# Patient Record
Sex: Male | Born: 1960 | Race: White | Hispanic: No | Marital: Married | Smoking: Never smoker
Health system: Southern US, Community
[De-identification: ages and names within clinical notes are randomized; demographics above are authoritative.]

## PROBLEM LIST (undated history)

## (undated) DIAGNOSIS — E119 Type 2 diabetes mellitus without complications: Secondary | ICD-10-CM

## (undated) HISTORY — DX: Type 2 diabetes mellitus without complications: E11.9

---

## 2017-08-10 ENCOUNTER — Ambulatory Visit: Payer: Self-pay | Admitting: Family Medicine

## 2017-08-24 ENCOUNTER — Ambulatory Visit (INDEPENDENT_AMBULATORY_CARE_PROVIDER_SITE_OTHER): Payer: Commercial Managed Care - PPO | Admitting: Family Medicine

## 2017-08-24 ENCOUNTER — Encounter: Payer: Self-pay | Admitting: Family Medicine

## 2017-08-24 VITALS — BP 123/66 | HR 78 | Temp 97.4°F | Ht 67.0 in | Wt 167.1 lb

## 2017-08-24 DIAGNOSIS — Z1211 Encounter for screening for malignant neoplasm of colon: Secondary | ICD-10-CM

## 2017-08-24 DIAGNOSIS — Z0000 Encounter for general adult medical examination without abnormal findings: Secondary | ICD-10-CM | POA: Diagnosis not present

## 2017-08-24 DIAGNOSIS — K4090 Unilateral inguinal hernia, without obstruction or gangrene, not specified as recurrent: Secondary | ICD-10-CM | POA: Diagnosis not present

## 2017-08-24 DIAGNOSIS — E119 Type 2 diabetes mellitus without complications: Secondary | ICD-10-CM | POA: Diagnosis not present

## 2017-08-24 DIAGNOSIS — R7301 Impaired fasting glucose: Secondary | ICD-10-CM

## 2017-08-24 LAB — BAYER DCA HB A1C WAIVED: HB A1C (BAYER DCA - WAIVED): 11 % — ABNORMAL HIGH (ref <7.0)

## 2017-08-24 LAB — URINALYSIS
Bilirubin, UA: NEGATIVE
KETONES UA: NEGATIVE
Leukocytes, UA: NEGATIVE
Nitrite, UA: NEGATIVE
Protein, UA: NEGATIVE
SPEC GRAV UA: 1.025 (ref 1.005–1.030)
Urobilinogen, Ur: 0.2 mg/dL (ref 0.2–1.0)
pH, UA: 5.5 (ref 5.0–7.5)

## 2017-08-24 MED ORDER — LANCETS MISC. MISC: 3 refills | Status: DC

## 2017-08-24 MED ORDER — METFORMIN HCL 500 MG PO TABS: 500.0000 mg | ORAL_TABLET | Freq: Two times a day (BID) | ORAL | 0 refills | Status: DC

## 2017-08-24 MED ORDER — GLUCOSE BLOOD VI STRP: ORAL_STRIP | 3 refills | Status: DC

## 2017-08-24 MED ORDER — BLOOD GLUCOSE MONITOR SYSTEM W/DEVICE KIT: 1.0000 | PACK | Freq: Two times a day (BID) | 0 refills | Status: DC

## 2017-08-24 NOTE — Patient Instructions (Signed)

## 2017-08-24 NOTE — Progress Notes (Signed)
Subjective:  Patient ID: Cory Morales, male    DOB: 15-Jul-1960  Age: 57 y.o. MRN: [id number]  CC: New Patient (Initial Visit)   HPI Cory Morales presents for new onset  diabetes. Patient checks blood sugar at home.  This is because he has a family history and he has been concerned. Readings have been running first around 150 and up to 180 then went to 200.  He says that his feet feel cold at times.  He is concerned that may be diabetic circulation. Patient denies symptoms such as polyuria, polydipsia, excessive hunger, nausea No significant hypoglycemic spells noted. He is currently taking no medications of any type for any medical problem.  Patient tells me that he has a positive family history for diabetes and his mother is under treatment with oral medications.  He is a non-smoker.  He is currently denying polyuria polydipsia excessive hunger.  He brings in a [facility] assessment showing that his cholesterol has been in normal range.  His PSA is 1.1.  He has no sign of aortic abdominal aneurysm.  Essentially all of his parameters were normal with the exception of the A1c.  That report was scanned and is attached.  History Cory Morales has no past medical history on file.   He has no past surgical history on file.   His family history includes COPD in his mother; Diabetes in his mother; Heart disease in his mother.He reports that he has never smoked. He has never used smokeless tobacco. He reports that he does not drink alcohol or use drugs.  No current outpatient medications on file prior to visit.   No current facility-administered medications on file prior to visit.     ROS Review of Systems  Constitutional: Negative for activity change, appetite change, chills, diaphoresis, fatigue, fever and unexpected weight change.  HENT: Negative for congestion, ear pain, hearing loss, postnasal drip, rhinorrhea, sore throat, tinnitus and trouble swallowing.   Eyes: Negative for  photophobia, pain, discharge and redness.  Respiratory: Negative for apnea, cough, choking, chest tightness, shortness of breath, wheezing and stridor.   Cardiovascular: Negative for chest pain, palpitations and leg swelling.  Gastrointestinal: Negative for abdominal distention, abdominal pain, blood in stool, constipation, diarrhea, nausea and vomiting.  Endocrine: Negative for cold intolerance, heat intolerance, polydipsia, polyphagia and polyuria.  Genitourinary: Positive for scrotal swelling. Negative for difficulty urinating, dysuria, enuresis, flank pain, frequency, genital sores, hematuria and urgency.  Musculoskeletal: Negative for arthralgias and joint swelling.  Skin: Negative for color change, rash and wound.  Allergic/Immunologic: Negative for immunocompromised state.  Neurological: Negative for dizziness, tremors, seizures, syncope, facial asymmetry, speech difficulty, weakness, light-headedness, numbness and headaches.  Hematological: Does not bruise/bleed easily.  Psychiatric/Behavioral: Negative for agitation, behavioral problems, confusion, decreased concentration, dysphoric mood, hallucinations, sleep disturbance and suicidal ideas. The patient is not nervous/anxious and is not hyperactive.     Objective:  BP 123/66   Pulse 78   Temp (!) 97.4 F (36.3 C) (Oral)   Ht 5' 7"  (1.702 m)   Wt 167 lb 2 oz (75.8 kg)   BMI 26.18 kg/m   BP Readings from Last 3 Encounters:  08/24/17 123/66    Wt Readings from Last 3 Encounters:  08/24/17 167 lb 2 oz (75.8 kg)     Physical Exam  Constitutional: He is oriented to person, place, and time. He appears well-developed and well-nourished.  HENT:  Head: Normocephalic and atraumatic.  Mouth/Throat: Oropharynx is clear and moist.  Eyes: Pupils  are equal, round, and reactive to light. EOM are normal.  Neck: Normal range of motion. No tracheal deviation present. No thyromegaly present.  Cardiovascular: Normal rate, regular rhythm  and normal heart sounds. Exam reveals no gallop and no friction rub.  No murmur heard. Pulmonary/Chest: Breath sounds normal. He has no wheezes. He has no rales.  Abdominal: Soft. He exhibits no mass. There is no tenderness. A hernia is present. Hernia confirmed positive in the right inguinal area and confirmed positive in the left inguinal area.  Musculoskeletal: Normal range of motion. He exhibits no edema.  Neurological: He is alert and oriented to person, place, and time.  Skin: Skin is warm and dry.  Psychiatric: He has a normal mood and affect.    Results for orders placed or performed in visit on 08/24/17  Bayer DCA Hb A1c Waived  Result Value Ref Range   Bayer DCA Hb A1c Waived 11.0 (H) <7.0 %  Urinalysis  Result Value Ref Range   Specific Gravity, UA 1.025 1.005 - 1.030   pH, UA 5.5 5.0 - 7.5   Color, UA Yellow Yellow   Appearance Ur Clear Clear   Leukocytes, UA Negative Negative   Protein, UA Negative Negative/Trace   Glucose, UA 3+ (A) Negative   Ketones, UA Negative Negative   RBC, UA Trace (A) Negative   Bilirubin, UA Negative Negative   Urobilinogen, Ur 0.2 0.2 - 1.0 mg/dL   Nitrite, UA Negative Negative   Abdominal aortic aneurysm ultrasound reviewed showing negative for aneurysm.  Assessment & Plan:   Cory Morales was seen today for new patient (initial visit).  Diagnoses and all orders for this visit:  Elevated fasting blood sugar -     Cancel: Bayer DCA Hb A1c Waived -     Cancel: Glucose Hemocue Waived  Newly diagnosed diabetes ([place]) -     C-peptide -     CMP14+EGFR -     Bayer DCA Hb A1c Waived -     Urinalysis  Screening for colon cancer -     Ambulatory referral to Gastroenterology  Right inguinal hernia -     Ambulatory referral to General Surgery  Well adult exam  Other orders -     metFORMIN (GLUCOPHAGE) 500 MG tablet; Take 1 tablet (500 mg total) by mouth 2 (two) times daily with a meal. -     Blood Glucose Monitoring Suppl (BLOOD GLUCOSE  MONITOR SYSTEM) w/Device KIT; 1 Device by Does not apply route 2 (two) times daily. -     Lancets Misc. MISC; Use to check blood sugars -     glucose blood (IGLUCOSE TEST STRIPS) test strip; Use as instructed      I am having Cory Morales start on metFORMIN, Blood Glucose Monitor System, Lancets Misc., and glucose blood.  Meds ordered this encounter  Medications  . metFORMIN (GLUCOPHAGE) 500 MG tablet    Sig: Take 1 tablet (500 mg total) by mouth 2 (two) times daily with a meal.    Dispense:  60 tablet    Refill:  0  . Blood Glucose Monitoring Suppl (BLOOD GLUCOSE MONITOR SYSTEM) w/Device KIT    Sig: 1 Device by Does not apply route 2 (two) times daily.    Dispense:  1 each    Refill:  0  . Lancets Misc. MISC    Sig: Use to check blood sugars    Dispense:  100 each    Refill:  3  . glucose blood (IGLUCOSE TEST  STRIPS) test strip    Sig: Use as instructed    Dispense:  100 each    Refill:  3   Elevated A1c of 11.0 noted today.  Patient will be placed on medication and extensive discussion regarding diabetic diet with handout given as well as regular exercise were reviewed.  Additionally diabetic pathophysiology particularly type I versus type II insulin resistance difference between complex and simple carbs were all reviewed.  Patient was encouraged to check his blood sugar fasting and postprandial daily.  Follow-up: Return in about 6 weeks (around 10/05/2017).  Claretta Fraise, M.D.

## 2017-08-25 ENCOUNTER — Encounter (INDEPENDENT_AMBULATORY_CARE_PROVIDER_SITE_OTHER): Payer: Self-pay | Admitting: *Deleted

## 2017-08-25 LAB — CMP14+EGFR
A/G RATIO: 1.6 (ref 1.2–2.2)
ALT: 20 IU/L (ref 0–44)
AST: 11 IU/L (ref 0–40)
Albumin: 4.5 g/dL (ref 3.5–5.5)
Alkaline Phosphatase: 98 IU/L (ref 39–117)
BILIRUBIN TOTAL: 0.3 mg/dL (ref 0.0–1.2)
BUN/Creatinine Ratio: 16 (ref 9–20)
BUN: 16 mg/dL (ref 6–24)
CHLORIDE: 97 mmol/L (ref 96–106)
CO2: 24 mmol/L (ref 20–29)
Calcium: 9.3 mg/dL (ref 8.7–10.2)
Creatinine, Ser: 1 mg/dL (ref 0.76–1.27)
GFR calc non Af Amer: 83 mL/min/{1.73_m2} (ref >59)
GFR, EST AFRICAN AMERICAN: 96 mL/min/{1.73_m2} (ref >59)
Globulin, Total: 2.8 g/dL (ref 1.5–4.5)
Glucose: 267 mg/dL — ABNORMAL HIGH (ref 65–99)
Potassium: 4.6 mmol/L (ref 3.5–5.2)
Sodium: 136 mmol/L (ref 134–144)
TOTAL PROTEIN: 7.3 g/dL (ref 6.0–8.5)

## 2017-08-25 LAB — C-PEPTIDE: C-Peptide: 2.7 ng/mL (ref 1.1–4.4)

## 2017-09-16 ENCOUNTER — Ambulatory Visit: Payer: Commercial Managed Care - PPO | Admitting: General Surgery

## 2017-09-22 ENCOUNTER — Other Ambulatory Visit: Payer: Self-pay | Admitting: Family Medicine

## 2017-10-05 ENCOUNTER — Encounter: Payer: Self-pay | Admitting: General Surgery

## 2017-10-05 ENCOUNTER — Encounter: Payer: Self-pay | Admitting: Family Medicine

## 2017-10-05 ENCOUNTER — Ambulatory Visit (INDEPENDENT_AMBULATORY_CARE_PROVIDER_SITE_OTHER): Payer: Commercial Managed Care - PPO | Admitting: General Surgery

## 2017-10-05 ENCOUNTER — Ambulatory Visit (INDEPENDENT_AMBULATORY_CARE_PROVIDER_SITE_OTHER): Payer: Commercial Managed Care - PPO | Admitting: Family Medicine

## 2017-10-05 VITALS — BP 135/87 | HR 68 | Temp 98.6°F | Resp 18 | Wt 168.0 lb

## 2017-10-05 VITALS — BP 122/77 | HR 70 | Ht 67.0 in | Wt 168.4 lb

## 2017-10-05 DIAGNOSIS — Z23 Encounter for immunization: Secondary | ICD-10-CM

## 2017-10-05 DIAGNOSIS — E119 Type 2 diabetes mellitus without complications: Secondary | ICD-10-CM | POA: Diagnosis not present

## 2017-10-05 DIAGNOSIS — K4090 Unilateral inguinal hernia, without obstruction or gangrene, not specified as recurrent: Secondary | ICD-10-CM | POA: Diagnosis not present

## 2017-10-05 NOTE — Progress Notes (Signed)
[facility] History and Physical  Reason for Referral:Inguinal Hernia  Referring Physician:  Dr. Livia Morales   Chief Complaint    Inguinal Hernia      Cory Morales is a 57 y.o. male.  HPI: Cory Morales is a 57 yo who recently saw Dr. Livia Morales for initiation of Primary Care due to concern for elevated BS and cold feet. He was ultimately given a prescription for metformin, and underwent a physical that revealed a right inguinal hernia and a small left inguinal hernia.   He reports that the right side has been present for 1-2 years and is uncomfortable at times. He has to push the hernia back in when he is on his feet at work, and that the bulge can cause some pain.  He has never had issues with the hernia getting stuck out or hard, and denies any obstructive type symptoms. He reports eating without issue and having daily BMs. He has never had a colonoscopy.  He denies smoking.   Past Medical History:  Diagnosis Date  . Diabetes mellitus without complication Cory Morales)     Past Surgical History:  Procedure Laterality Date  . NO PAST SURGERIES      Family History  Problem Relation Age of Onset  . COPD Mother   . Diabetes Mother   . Heart disease Mother     Social History   Tobacco Use  . Smoking status: Never Smoker  . Smokeless tobacco: Never Used  Substance Use Topics  . Alcohol use: Never    Frequency: Never  . Drug use: Never    Medications: I have reviewed the patient's current medications. Allergies as of 10/05/2017   No Known Allergies     Medication List        Accurate as of 10/05/17 11:59 PM. Always use your most recent med list.          Blood Glucose Monitor System w/Device Kit 1 Device by Does not apply route 2 (two) times daily.   glucose blood test strip Commonly known as:  IGLUCOSE TEST STRIPS Use as instructed   Lancets Misc. Misc Use to check blood sugars   metFORMIN 500 MG tablet Commonly known as:  GLUCOPHAGE TAKE  (1)   TABLET TWICE A DAY WITH MEALS (BREAKFAST AND SUPPER)        ROS:  A comprehensive review of systems was negative except for: Gastrointestinal: positive for right inguinal discomfort with hernia   Blood pressure 135/87, pulse 68, temperature 98.6 F (37 C), temperature source Temporal, resp. rate 18, weight 168 lb (76.2 kg). Physical Exam  Constitutional: He is oriented to person, place, and time. He appears well-developed and well-nourished.  HENT:  Head: Normocephalic.  Eyes: Pupils are equal, round, and reactive to light.  Neck: Normal range of motion. Neck supple.  Cardiovascular: Normal rate and regular rhythm.  Pulmonary/Chest: Effort normal and breath sounds normal.  Abdominal: Soft. He exhibits no distension. There is no tenderness. A hernia is present. Hernia confirmed positive in the right inguinal area and confirmed positive in the left inguinal area.  Small left inguinal hernia, right inguinal hernia with large internal ring appreciated  Musculoskeletal: Normal range of motion. He exhibits no edema.  Neurological: He is alert and oriented to person, place, and time.  Skin: Skin is warm and dry.  Psychiatric: He has a normal mood and affect. His behavior is normal. Judgment and thought content normal.  Vitals reviewed.   Results: None   Assessment &  Plan:  Cory Morales. Cory Morales is a 57 y.o. male with a right and left inguinal hernia. He has symptoms on the right and has not noticed the left. He is active and does have to stand and be physical at work.  He has been recently placed on metformin for his diabetes and A1C of 11.    We have discussed the options for inguinal hernia repair and the option of open versus a laparoscopic surgery. We have discussed that given his symptoms the right side does need to be repaired.  We have discussed that I do open hernia repairs with mesh, and additional reasons for laparoscopic repair.  We have discussed the risk of incarceration and the  need for elective repair if having some symptoms such as discomfort with the herniation.    Discussed the risk and benefits including, bleeding, infection, use of mesh, risk of recurrence, risk of nerve damage causing numbness or changes in sensation, risk of damage to the cord structures. The patient understands the risk and benefits of repair with mesh.    We discussed the need for recovery and no lifting > 10 lbs for 8 weeks after surgery.  The patient is going to think about his options, and will let us know what he decides to do in the upcoming weeks.   All questions were answered to the satisfaction of the patient.    Cory Morales 10/06/2017, 7:52 AM

## 2017-10-05 NOTE — Patient Instructions (Signed)
Open Hernia Repair, Adult Open hernia repair is a surgical procedure to fix a hernia. A hernia occurs when an internal organ or tissue pushes out through a weak spot in the abdominal wall muscles. Hernias commonly occur in the groin and around the navel. Most hernias tend to get worse over time. Often, surgery is done to prevent the hernia from becoming bigger, uncomfortable, or an emergency. Emergency surgery may be needed if abdominal contents get stuck in the opening (incarcerated hernia) or the blood supply gets cut off (strangulated hernia). In an open repair, an incision is made in the abdomen to perform the surgery. Tell a health care provider about:  Any allergies you have.  All medicines you are taking, including vitamins, herbs, eye drops, creams, and over-the-counter medicines.  Any problems you or family members have had with anesthetic medicines.  Any blood or bone disorders you have.  Any surgeries you have had.  Any medical conditions you have, including any recent cold or flu symptoms.  Whether you are pregnant or may be pregnant. What are the risks? Generally, this is a safe procedure. However, problems may occur, including:  Long-lasting (chronic) pain.  Bleeding.  Infection.  Damage to the testicle. This can cause shrinking or swelling.  Damage to the bladder, blood vessels, intestine, or nerves near the hernia.  Trouble passing urine.  Allergic reactions to medicines.  Return of the hernia.  What happens before the procedure? Staying hydrated Follow instructions from your health care provider about hydration, which may include:  Up to 2 hours before the procedure - you may continue to drink clear liquids, such as water, clear fruit juice, black coffee, and plain tea.  Eating and drinking restrictions Follow instructions from your health care provider about eating and drinking, which may include:  8 hours before the procedure - stop eating heavy meals  or foods such as meat, fried foods, or fatty foods.  6 hours before the procedure - stop eating light meals or foods, such as toast or cereal.  6 hours before the procedure - stop drinking milk or drinks that contain milk.  2 hours before the procedure - stop drinking clear liquids.  Medicines  Ask your health care provider about: ? Changing or stopping your regular medicines. This is especially important if you are taking diabetes medicines or blood thinners. ? Taking medicines such as aspirin and ibuprofen. These medicines can thin your blood. Do not take these medicines before your procedure if your health care provider instructs you not to.  You may be given antibiotic medicine to help prevent infection. General instructions  You may have blood tests or imaging studies.  Ask your health care provider how your surgical site will be marked or identified.  If you smoke, do not smoke for at least 2 weeks before your procedure or for as long as told by your health care provider.  Let your health care provider know if you develop a cold or any infection before your surgery.  Plan to have someone take you home from the hospital or clinic.  If you will be going home right after the procedure, plan to have someone with you for 24 hours. What happens during the procedure?  To reduce your risk of infection: ? Your health care team will wash or sanitize their hands. ? Your skin will be washed with soap. ? Hair may be removed from the surgical area.  An IV tube will be inserted into one of your veins.  You will be given one or more of the following: ? A medicine to help you relax (sedative). ? A medicine to numb the area (local anesthetic). ? A medicine to make you fall asleep (general anesthetic).  Your surgeon will make an incision over the hernia.  The tissues of the hernia will be moved back into place.  The edges of the hernia may be stitched together.  The opening in the  abdominal muscles will be closed with stitches (sutures). Or, your surgeon will place a mesh patch made of manmade (synthetic) material over the opening.  The incision will be closed.  A bandage (dressing) may be placed over the incision. The procedure may vary among health care providers and hospitals. What happens after the procedure?  Your blood pressure, heart rate, breathing rate, and blood oxygen level will be monitored until the medicines you were given have worn off.  You may be given medicine for pain.  Do not drive for 24 hours if you received a sedative. This information is not intended to replace advice given to you by your health care provider. Make sure you discuss any questions you have with your health care provider. Document Released: 11/11/2000 Document Revised: 12/06/2015 Document Reviewed: 10/30/2015 Elsevier Interactive Patient Education  2018 [employer].  Inguinal Hernia, Adult An inguinal hernia is when fat or the intestines push through the area where the leg meets the lower belly (groin) and make a rounded lump (bulge). This condition happens over time. There are three types of inguinal hernias. These types include:  Hernias that can be pushed back into the belly (are reducible).  Hernias that cannot be pushed back into the belly (are incarcerated).  Hernias that cannot be pushed back into the belly and lose their blood supply (get strangulated). This type needs emergency surgery.  Follow these instructions at home: Lifestyle  Drink enough fluid to keep your urine (pee) clear or pale yellow.  Eat plenty of fruits, vegetables, and whole grains. These have a lot of fiber. Talk with your doctor if you have questions.  Avoid lifting heavy objects.  Avoid standing for long periods of time.  Do not use tobacco products. These include cigarettes, chewing tobacco, or e-cigarettes. If you need help quitting, ask your doctor.  Try to stay at a healthy  weight. General instructions  Do not try to force the hernia back in.  Watch your hernia for any changes in color or size. Let your doctor know if there are any changes.  Take over-the-counter and prescription medicines only as told by your doctor.  Keep all follow-up visits as told by your doctor. This is important. Contact a doctor if:  You have a fever.  You have new symptoms.  Your symptoms get worse. Get help right away if:  The area where the legs meets the lower belly has: ? Pain that gets worse suddenly. ? A bulge that gets bigger suddenly and does not go down. ? A bulge that turns red or purple. ? A bulge that is painful to the touch.  You are a man and your scrotum: ? Suddenly feels painful. ? Suddenly changes in size.  You feel sick to your stomach (nauseous) and this feeling does not go away.  You throw up (vomit) and this keeps happening.  You feel your heart beating a lot more quickly than normal.  You cannot poop (have a bowel movement) or pass gas. This information is not intended to replace advice given to you  by your health care provider. Make sure you discuss any questions you have with your health care provider. Document Released: 06/18/2006 Document Revised: 10/24/2015 Document Reviewed: 03/28/2014 Elsevier Interactive Patient Education  2018 [employer].

## 2017-10-06 ENCOUNTER — Encounter: Payer: Self-pay | Admitting: General Surgery

## 2017-10-06 ENCOUNTER — Encounter: Payer: Self-pay | Admitting: Family Medicine

## 2017-10-06 NOTE — Progress Notes (Signed)
   Subjective:  Patient ID: Cory Morales, male    DOB: Jun 19, 1960  Age: 57 y.o. MRN: [id number]  CC: Diabetes (pt here today following up on his diabetes)   HPI Cory Morales presents forFollow-up of diabetes. Newly diagnosed. Started monitoring 1 week ago. Log attached   150-190  fasting and 140-239 postprandial Patient denies symptoms such as polyuria, polydipsia, excessive hunger, nausea No significant hypoglycemic spells noted. Medications reviewed. Pt reports taking them regularly without complication/adverse reaction being reported today.    History Cory Morales has a past medical history of Diabetes mellitus without complication ([place]).   He has a past surgical history that includes No past surgeries.   His family history includes COPD in his mother; Diabetes in his mother; Heart disease in his mother.He reports that he has never smoked. He has never used smokeless tobacco. He reports that he does not drink alcohol or use drugs.  Current Outpatient Medications on File Prior to Visit  Medication Sig Dispense Refill  . Blood Glucose Monitoring Suppl (BLOOD GLUCOSE MONITOR SYSTEM) w/Device KIT 1 Device by Does not apply route 2 (two) times daily. 1 each 0  . glucose blood (IGLUCOSE TEST STRIPS) test strip Use as instructed 100 each 3  . Lancets Misc. MISC Use to check blood sugars 100 each 3  . metFORMIN (GLUCOPHAGE) 500 MG tablet TAKE  (1)  TABLET TWICE A DAY WITH MEALS (BREAKFAST AND SUPPER) 60 tablet 1   No current facility-administered medications on file prior to visit.     ROS Review of Systems  Constitutional: Negative for fever.  Respiratory: Negative for shortness of breath.   Cardiovascular: Negative for chest pain.  Musculoskeletal: Negative for arthralgias.  Skin: Negative for rash.    Objective:  BP 122/77   Pulse 70   Ht '5\' 7"'$  (1.702 m)   Wt 168 lb 6 oz (76.4 kg)   BMI 26.37 kg/m   BP Readings from Last 3 Encounters:  10/05/17 122/77  10/05/17  135/87  08/24/17 123/66    Wt Readings from Last 3 Encounters:  10/05/17 168 lb 6 oz (76.4 kg)  10/05/17 168 lb (76.2 kg)  08/24/17 167 lb 2 oz (75.8 kg)     Physical Exam  Constitutional: He is oriented to person, place, and time. He appears well-developed and well-nourished.  HENT:  Head: Normocephalic and atraumatic.  Right Ear: External ear normal.  Left Ear: External ear normal.  Mouth/Throat: No oropharyngeal exudate or posterior oropharyngeal erythema.  Eyes: Pupils are equal, round, and reactive to light.  Neck: Normal range of motion. Neck supple.  Cardiovascular: Normal rate and regular rhythm.  No murmur heard. Pulmonary/Chest: Breath sounds normal. No respiratory distress.  Neurological: He is alert and oriented to person, place, and time.  Vitals reviewed.     Assessment & Plan:   Cory Morales was seen today for diabetes.  Diagnoses and all orders for this visit:  Newly diagnosed diabetes ([place])  Other orders -     Tdap vaccine greater than or equal to 7yo IM   Continue metformin. Check glucose BID daily. Counting carbs, emphassizzing complex carbs reviewed.    I am having Cory Side L. Simkins maintain his Blood Glucose Monitor System, Lancets Misc., glucose blood, and metFORMIN.  No orders of the defined types were placed in this encounter.    Follow-up: Return in about 6 weeks (around 11/16/2017).  Claretta Fraise, M.D.

## 2017-10-12 NOTE — H&P (Signed)
[facility] History and Physical  Reason for Referral:Inguinal Hernia  Referring Physician:  Dr. Stacks   Chief Complaint    Inguinal Hernia      Cory Morales is a 57 y.o. male.  HPI: Cory Morales is a 57 yo who recently saw Dr. Stacks for initiation of Primary Care due to concern for elevated BS and cold feet. He was ultimately given a prescription for metformin, and underwent a physical that revealed a right inguinal hernia and a small left inguinal hernia.   He reports that the right side has been present for 1-2 years and is uncomfortable at times. He has to push the hernia back in when he is on his feet at work, and that the bulge can cause some pain.  He has never had issues with the hernia getting stuck out or hard, and denies any obstructive type symptoms. He reports eating without issue and having daily BMs. He has never had a colonoscopy.  He denies smoking.   Past Medical History:  Diagnosis Date  . Diabetes mellitus without complication (HCC)     Past Surgical History:  Procedure Laterality Date  . NO PAST SURGERIES      Family History  Problem Relation Age of Onset  . COPD Mother   . Diabetes Mother   . Heart disease Mother     Social History   Tobacco Use  . Smoking status: Never Smoker  . Smokeless tobacco: Never Used  Substance Use Topics  . Alcohol use: Never    Frequency: Never  . Drug use: Never    Medications: I have reviewed the patient's current medications. Allergies as of 10/05/2017   No Known Allergies     Medication List        Accurate as of 10/05/17 11:59 PM. Always use your most recent med list.          Blood Glucose Monitor System w/Device Kit 1 Device by Does not apply route 2 (two) times daily.   glucose blood test strip Commonly known as:  IGLUCOSE TEST STRIPS Use as instructed   Lancets Misc. Misc Use to check blood sugars   metFORMIN 500 MG tablet Commonly known as:  GLUCOPHAGE TAKE  (1)   TABLET TWICE A DAY WITH MEALS (BREAKFAST AND SUPPER)        ROS:  A comprehensive review of systems was negative except for: Gastrointestinal: positive for right inguinal discomfort with hernia   Blood pressure 135/87, pulse 68, temperature 98.6 F (37 C), temperature source Temporal, resp. rate 18, weight 168 lb (76.2 kg). Physical Exam  Constitutional: He is oriented to person, place, and time. He appears well-developed and well-nourished.  HENT:  Head: Normocephalic.  Eyes: Pupils are equal, round, and reactive to light.  Neck: Normal range of motion. Neck supple.  Cardiovascular: Normal rate and regular rhythm.  Pulmonary/Chest: Effort normal and breath sounds normal.  Abdominal: Soft. He exhibits no distension. There is no tenderness. A hernia is present. Hernia confirmed positive in the right inguinal area and confirmed positive in the left inguinal area.  Small left inguinal hernia, right inguinal hernia with large internal ring appreciated  Musculoskeletal: Normal range of motion. He exhibits no edema.  Neurological: He is alert and oriented to person, place, and time.  Skin: Skin is warm and dry.  Psychiatric: He has a normal mood and affect. His behavior is normal. Judgment and thought content normal.  Vitals reviewed.   Results: None   Assessment &   Plan:  Cory Morales is a 57 y.o. male with a right and left inguinal hernia. He has symptoms on the right and has not noticed the left. He is active and does have to stand and be physical at work.  He has been recently placed on metformin for his diabetes and A1C of 11.    We have discussed the options for inguinal hernia repair and the option of open versus a laparoscopic surgery. We have discussed that given his symptoms the right side does need to be repaired.  We have discussed that I do open hernia repairs with mesh, and additional reasons for laparoscopic repair.  We have discussed the risk of incarceration and the  need for elective repair if having some symptoms such as discomfort with the herniation.    Discussed the risk and benefits including, bleeding, infection, use of mesh, risk of recurrence, risk of nerve damage causing numbness or changes in sensation, risk of damage to the cord structures. The patient understands the risk and benefits of repair with mesh.    We discussed the need for recovery and no lifting > 10 lbs for 8 weeks after surgery.  The patient is going to think about his options, and will let us know what he decides to do in the upcoming weeks.   All questions were answered to the satisfaction of the patient.    Cory Morales 10/06/2017, 7:52 AM      

## 2017-10-26 ENCOUNTER — Other Ambulatory Visit: Payer: Self-pay | Admitting: Family Medicine

## 2017-10-28 NOTE — Patient Instructions (Signed)
Cory Morales  10/28/2017     @PREFPERIOPPHARMACY @   Your procedure is scheduled on 11/08/2017.  Report to [facility] at 6:15 A.M.  Call this number if you have problems the morning of surgery:  [phone]   Remember:  No food or drink after midnight.      Take these medicines the morning of surgery with A SIP OF WATER None    Do not wear jewelry, make-up or nail polish.  Do not wear lotions, powders, or perfumes, or deodorant.  Do not shave 48 hours prior to surgery.  Men may shave face and neck.  Do not bring valuables to the hospital.  [facility] is not responsible for any belongings or valuables.  Contacts, dentures or bridgework may not be worn into surgery.  Leave your suitcase in the car.  After surgery it may be brought to your room.  For patients admitted to the hospital, discharge time will be determined by your treatment team.  Patients discharged the day of surgery will not be allowed to drive home.    Please read over the following fact sheets that you were given. Anesthesia Post-op Instructions     PATIENT INSTRUCTIONS POST-ANESTHESIA  IMMEDIATELY FOLLOWING SURGERY:  Do not drive or operate machinery for the first twenty four hours after surgery.  Do not make any important decisions for twenty four hours after surgery or while taking narcotic pain medications or sedatives.  If you develop intractable nausea and vomiting or a severe headache please notify your doctor immediately.  FOLLOW-UP:  Please make an appointment with your surgeon as instructed. You do not need to follow up with anesthesia unless specifically instructed to do so.  WOUND CARE INSTRUCTIONS (if applicable):  Keep a dry clean dressing on the anesthesia/puncture wound site if there is drainage.  Once the wound has quit draining you may leave it open to air.  Generally you should leave the bandage intact for twenty four hours unless there is drainage.  If the epidural site drains for  more than 36-48 hours please call the anesthesia department.  QUESTIONS?:  Please feel free to call your physician or the hospital operator if you have any questions, and they will be happy to assist you.      Inguinal Hernia, Adult An inguinal hernia is when fat or the intestines push through the area where the leg meets the lower belly (groin) and make a rounded lump (bulge). This condition happens over time. There are three types of inguinal hernias. These types include:  Hernias that can be pushed back into the belly (are reducible).  Hernias that cannot be pushed back into the belly (are incarcerated).  Hernias that cannot be pushed back into the belly and lose their blood supply (get strangulated). This type needs emergency surgery.  Follow these instructions at home: Lifestyle  Drink enough fluid to keep your urine (pee) clear or pale yellow.  Eat plenty of fruits, vegetables, and whole grains. These have a lot of fiber. Talk with your doctor if you have questions.  Avoid lifting heavy objects.  Avoid standing for long periods of time.  Do not use tobacco products. These include cigarettes, chewing tobacco, or e-cigarettes. If you need help quitting, ask your doctor.  Try to stay at a healthy weight. General instructions  Do not try to force the hernia back in.  Watch your hernia for any changes in color or size. Let your doctor know if there are any changes.  Take over-the-counter and prescription medicines only as told by your doctor.  Keep all follow-up visits as told by your doctor. This is important. Contact a doctor if:  You have a fever.  You have new symptoms.  Your symptoms get worse. Get help right away if:  The area where the legs meets the lower belly has: ? Pain that gets worse suddenly. ? A bulge that gets bigger suddenly and does not go down. ? A bulge that turns red or purple. ? A bulge that is painful to the touch.  You are a man and your  scrotum: ? Suddenly feels painful. ? Suddenly changes in size.  You feel sick to your stomach (nauseous) and this feeling does not go away.  You throw up (vomit) and this keeps happening.  You feel your heart beating a lot more quickly than normal.  You cannot poop (have a bowel movement) or pass gas. This information is not intended to replace advice given to you by your health care provider. Make sure you discuss any questions you have with your health care provider. Document Released: 06/18/2006 Document Revised: 10/24/2015 Document Reviewed: 03/28/2014 Elsevier Interactive Patient Education  2018 [employer].

## 2017-10-29 ENCOUNTER — Encounter (HOSPITAL_COMMUNITY)
Admission: RE | Admit: 2017-10-29 | Discharge: 2017-10-29 | Disposition: A | Discharge status: Home / Self Care | Payer: Commercial Managed Care - PPO | Source: Ambulatory Visit | Attending: General Surgery | Admitting: General Surgery

## 2017-10-29 ENCOUNTER — Other Ambulatory Visit: Payer: Self-pay

## 2017-10-29 ENCOUNTER — Encounter (HOSPITAL_COMMUNITY): Payer: Self-pay

## 2017-10-29 DIAGNOSIS — Z01812 Encounter for preprocedural laboratory examination: Secondary | ICD-10-CM | POA: Diagnosis not present

## 2017-10-29 DIAGNOSIS — Z01810 Encounter for preprocedural cardiovascular examination: Secondary | ICD-10-CM | POA: Diagnosis not present

## 2017-10-29 LAB — CBC
HEMATOCRIT: 38.2 % — AB (ref 39.0–52.0)
Hemoglobin: 12.6 g/dL — ABNORMAL LOW (ref 13.0–17.0)
MCH: 28.6 pg (ref 26.0–34.0)
MCHC: 33 g/dL (ref 30.0–36.0)
MCV: 86.6 fL (ref 78.0–100.0)
Platelets: 286 10*3/uL (ref 150–400)
RBC: 4.41 MIL/uL (ref 4.22–5.81)
RDW: 12.4 % (ref 11.5–15.5)
WBC: 8.2 10*3/uL (ref 4.0–10.5)

## 2017-10-29 LAB — BASIC METABOLIC PANEL
Anion gap: 10 (ref 5–15)
BUN: 17 mg/dL (ref 6–20)
CALCIUM: 9.3 mg/dL (ref 8.9–10.3)
CO2: 23 mmol/L (ref 22–32)
CREATININE: 0.97 mg/dL (ref 0.61–1.24)
Chloride: 102 mmol/L (ref 101–111)
GFR calc non Af Amer: >60 mL/min (ref >60)
Glucose, Bld: 200 mg/dL — ABNORMAL HIGH (ref 65–99)
Potassium: 3.9 mmol/L (ref 3.5–5.1)
SODIUM: 135 mmol/L (ref 135–145)

## 2017-10-29 LAB — HEMOGLOBIN A1C
Hgb A1c MFr Bld: 8.1 % — ABNORMAL HIGH (ref 4.8–5.6)
MEAN PLASMA GLUCOSE: 185.77 mg/dL

## 2017-10-29 LAB — GLUCOSE, CAPILLARY: Glucose-Capillary: 207 mg/dL — ABNORMAL HIGH (ref 65–99)

## 2017-11-08 ENCOUNTER — Ambulatory Visit (HOSPITAL_COMMUNITY): Payer: Commercial Managed Care - PPO | Admitting: Anesthesiology

## 2017-11-08 ENCOUNTER — Encounter (HOSPITAL_COMMUNITY): Payer: Self-pay | Admitting: Anesthesiology

## 2017-11-08 ENCOUNTER — Encounter (HOSPITAL_COMMUNITY)
Admission: RE | Disposition: A | Discharge status: Home / Self Care | Payer: Self-pay | Source: Ambulatory Visit | Attending: General Surgery

## 2017-11-08 ENCOUNTER — Ambulatory Visit (HOSPITAL_COMMUNITY)
Admission: RE | Admit: 2017-11-08 | Discharge: 2017-11-08 | Disposition: A | Discharge status: Home / Self Care | Payer: Commercial Managed Care - PPO | Source: Ambulatory Visit | Attending: General Surgery | Admitting: General Surgery

## 2017-11-08 DIAGNOSIS — Z7984 Long term (current) use of oral hypoglycemic drugs: Secondary | ICD-10-CM | POA: Insufficient documentation

## 2017-11-08 DIAGNOSIS — D176 Benign lipomatous neoplasm of spermatic cord: Secondary | ICD-10-CM | POA: Diagnosis not present

## 2017-11-08 DIAGNOSIS — K4090 Unilateral inguinal hernia, without obstruction or gangrene, not specified as recurrent: Secondary | ICD-10-CM

## 2017-11-08 DIAGNOSIS — E119 Type 2 diabetes mellitus without complications: Secondary | ICD-10-CM | POA: Insufficient documentation

## 2017-11-08 HISTORY — PX: INGUINAL HERNIA REPAIR: SHX194

## 2017-11-08 LAB — GLUCOSE, CAPILLARY
GLUCOSE-CAPILLARY: 182 mg/dL — AB (ref 65–99)
GLUCOSE-CAPILLARY: 183 mg/dL — AB (ref 65–99)

## 2017-11-08 SURGERY — REPAIR, HERNIA, INGUINAL, ADULT
Anesthesia: General | Laterality: Right

## 2017-11-08 MED ORDER — FENTANYL CITRATE (PF) 250 MCG/5ML IJ SOLN
INTRAMUSCULAR | Status: AC
  Filled 2017-11-08: qty 5

## 2017-11-08 MED ORDER — HYDROMORPHONE HCL 1 MG/ML IJ SOLN: 0.2500 mg | INTRAMUSCULAR | Status: DC | PRN

## 2017-11-08 MED ORDER — HYDROCODONE-ACETAMINOPHEN 7.5-325 MG PO TABS
1.0000 | ORAL_TABLET | Freq: Once | ORAL | Status: AC | PRN
  Administered 2017-11-08: 1 via ORAL
  Filled 2017-11-08: qty 1

## 2017-11-08 MED ORDER — LACTATED RINGERS IV SOLN
INTRAVENOUS | Status: DC
  Administered 2017-11-08: 1000 mL via INTRAVENOUS

## 2017-11-08 MED ORDER — LACTATED RINGERS IV SOLN: INTRAVENOUS | Status: DC

## 2017-11-08 MED ORDER — MIDAZOLAM HCL 2 MG/2ML IJ SOLN
INTRAMUSCULAR | Status: AC
  Filled 2017-11-08: qty 2

## 2017-11-08 MED ORDER — LIDOCAINE HCL (PF) 1 % IJ SOLN
INTRAMUSCULAR | Status: AC
  Filled 2017-11-08: qty 5

## 2017-11-08 MED ORDER — DOCUSATE SODIUM 100 MG PO CAPS: 100.0000 mg | ORAL_CAPSULE | Freq: Two times a day (BID) | ORAL | 2 refills | Status: DC

## 2017-11-08 MED ORDER — MIDAZOLAM HCL 5 MG/5ML IJ SOLN
INTRAMUSCULAR | Status: DC | PRN
  Administered 2017-11-08: 1 mg via INTRAVENOUS

## 2017-11-08 MED ORDER — OXYCODONE HCL 5 MG PO TABS: 5.0000 mg | ORAL_TABLET | ORAL | 0 refills | Status: DC | PRN

## 2017-11-08 MED ORDER — CEFAZOLIN SODIUM-DEXTROSE 2-4 GM/100ML-% IV SOLN
2.0000 g | INTRAVENOUS | Status: AC
  Administered 2017-11-08: 2 g via INTRAVENOUS
  Filled 2017-11-08: qty 100

## 2017-11-08 MED ORDER — KETOROLAC TROMETHAMINE 30 MG/ML IJ SOLN
INTRAMUSCULAR | Status: AC
  Filled 2017-11-08: qty 1

## 2017-11-08 MED ORDER — BUPIVACAINE LIPOSOME 1.3 % IJ SUSP
INTRAMUSCULAR | Status: DC | PRN
  Administered 2017-11-08: 20 mL

## 2017-11-08 MED ORDER — CHLORHEXIDINE GLUCONATE CLOTH 2 % EX PADS: 6.0000 | MEDICATED_PAD | Freq: Once | CUTANEOUS | Status: DC

## 2017-11-08 MED ORDER — FENTANYL CITRATE (PF) 100 MCG/2ML IJ SOLN
INTRAMUSCULAR | Status: DC | PRN
  Administered 2017-11-08 (×6): 25 ug via INTRAVENOUS

## 2017-11-08 MED ORDER — PROPOFOL 10 MG/ML IV BOLUS
INTRAVENOUS | Status: DC | PRN
  Administered 2017-11-08: 110 mg via INTRAVENOUS
  Administered 2017-11-08: 20 mg via INTRAVENOUS

## 2017-11-08 MED ORDER — SUCCINYLCHOLINE CHLORIDE 20 MG/ML IJ SOLN
INTRAMUSCULAR | Status: AC
  Filled 2017-11-08: qty 1

## 2017-11-08 MED ORDER — PROMETHAZINE HCL 25 MG/ML IJ SOLN: 6.2500 mg | INTRAMUSCULAR | Status: DC | PRN

## 2017-11-08 MED ORDER — BUPIVACAINE LIPOSOME 1.3 % IJ SUSP
INTRAMUSCULAR | Status: AC
  Filled 2017-11-08: qty 20

## 2017-11-08 MED ORDER — LIDOCAINE HCL (CARDIAC) PF 50 MG/5ML IV SOSY
PREFILLED_SYRINGE | INTRAVENOUS | Status: DC | PRN
  Administered 2017-11-08: 40 mg via INTRAVENOUS

## 2017-11-08 MED ORDER — ONDANSETRON HCL 4 MG/2ML IJ SOLN
INTRAMUSCULAR | Status: DC | PRN
  Administered 2017-11-08: 4 mg via INTRAVENOUS

## 2017-11-08 MED ORDER — SODIUM CHLORIDE 0.9 % IR SOLN
Status: DC | PRN
  Administered 2017-11-08: 1000 mL

## 2017-11-08 MED ORDER — KETOROLAC TROMETHAMINE 30 MG/ML IJ SOLN
30.0000 mg | Freq: Once | INTRAMUSCULAR | Status: AC
  Administered 2017-11-08: 30 mg via INTRAVENOUS

## 2017-11-08 MED ORDER — MEPERIDINE HCL 50 MG/ML IJ SOLN: 6.2500 mg | INTRAMUSCULAR | Status: DC | PRN

## 2017-11-08 SURGICAL SUPPLY — 39 items
CLOTH BEACON ORANGE TIMEOUT ST (SAFETY) ×2 IMPLANT
COVER LIGHT HANDLE STERIS (MISCELLANEOUS) ×4 IMPLANT
DERMABOND ADVANCED (GAUZE/BANDAGES/DRESSINGS) ×1
DERMABOND ADVANCED .7 DNX12 (GAUZE/BANDAGES/DRESSINGS) ×1 IMPLANT
DRAIN PENROSE 18X1/2 LTX STRL (DRAIN) ×2 IMPLANT
ELECT REM PT RETURN 9FT ADLT (ELECTROSURGICAL) ×2
ELECTRODE REM PT RTRN 9FT ADLT (ELECTROSURGICAL) ×1 IMPLANT
GLOVE BIO SURGEON STRL SZ 6.5 (GLOVE) ×2 IMPLANT
GLOVE BIOGEL PI IND STRL 6.5 (GLOVE) ×1 IMPLANT
GLOVE BIOGEL PI IND STRL 7.0 (GLOVE) ×2 IMPLANT
GLOVE BIOGEL PI IND STRL 7.5 (GLOVE) ×1 IMPLANT
GLOVE BIOGEL PI INDICATOR 6.5 (GLOVE) ×1
GLOVE BIOGEL PI INDICATOR 7.0 (GLOVE) ×2
GLOVE BIOGEL PI INDICATOR 7.5 (GLOVE) ×1
GOWN STRL REUS W/ TWL XL LVL3 (GOWN DISPOSABLE) ×1 IMPLANT
GOWN STRL REUS W/TWL LRG LVL3 (GOWN DISPOSABLE) ×4 IMPLANT
GOWN STRL REUS W/TWL XL LVL3 (GOWN DISPOSABLE) ×1
INST SET MINOR GENERAL (KITS) ×2 IMPLANT
KIT TURNOVER KIT A (KITS) ×2 IMPLANT
MANIFOLD NEPTUNE II (INSTRUMENTS) ×2 IMPLANT
MESH HERNIA 1.6X1.9 PLUG LRG (Mesh General) ×2 IMPLANT
MESH HERNIA PLUG LRG (Mesh General) ×2 IMPLANT
NEEDLE HYPO 21X1.5 SAFETY (NEEDLE) ×2 IMPLANT
NS IRRIG 1000ML POUR BTL (IV SOLUTION) ×2 IMPLANT
PACK MINOR (CUSTOM PROCEDURE TRAY) ×2 IMPLANT
PAD ARMBOARD 7.5X6 YLW CONV (MISCELLANEOUS) ×2 IMPLANT
SET BASIN LINEN APH (SET/KITS/TRAYS/PACK) ×2 IMPLANT
SUT MNCRL AB 4-0 PS2 18 (SUTURE) ×2 IMPLANT
SUT NOVA NAB GS-22 2 2-0 T-19 (SUTURE) ×8 IMPLANT
SUT PROLENE 2 0 SH 30 (SUTURE) IMPLANT
SUT SILK 3 0 (SUTURE)
SUT SILK 3-0 18XBRD TIE 12 (SUTURE) IMPLANT
SUT VIC AB 2-0 CT1 27 (SUTURE) ×1
SUT VIC AB 2-0 CT1 TAPERPNT 27 (SUTURE) ×1 IMPLANT
SUT VIC AB 3-0 SH 27 (SUTURE) ×1
SUT VIC AB 3-0 SH 27X BRD (SUTURE) ×1 IMPLANT
SUT VIC AB 4-0 PS2 27 (SUTURE) ×2 IMPLANT
SUT VICRYL AB 3 0 TIES (SUTURE) ×2 IMPLANT
SYR 20CC LL (SYRINGE) ×2 IMPLANT

## 2017-11-08 NOTE — Transfer of Care (Signed)
Immediate Anesthesia Transfer of Care Note  Patient: Cory Morales  Procedure(s) Performed: HERNIA REPAIR INGUINAL ADULT WITH MESH (Right )  Patient Location: PACU  Anesthesia Type:General  Level of Consciousness: awake and alert   Airway & Oxygen Therapy: Patient Spontanous Breathing  Post-op Assessment: Report given to RN  Post vital signs: Reviewed and stable  Last Vitals:  Vitals Value Taken Time  BP 141/89 11/08/2017  9:22 AM  Temp    Pulse 72 11/08/2017  9:25 AM  Resp 14 11/08/2017  9:25 AM  SpO2 97 % 11/08/2017  9:25 AM  Vitals shown include unvalidated device data.  Last Pain:  Vitals:   11/08/17 0639  TempSrc: Oral  PainSc: 0-No pain      Patients Stated Pain Goal: 8 (34/35/68 6168)  Complications: No apparent anesthesia complications

## 2017-11-08 NOTE — Anesthesia Procedure Notes (Signed)
Procedure Name: LMA Insertion Date/Time: 11/08/2017 7:36 AM Performed by: Ollen Bowl, CRNA Pre-anesthesia Checklist: Patient identified, Patient being monitored, Emergency Drugs available, Timeout performed and Suction available Patient Re-evaluated:Patient Re-evaluated prior to induction Oxygen Delivery Method: Circle System Utilized Preoxygenation: Pre-oxygenation with 100% oxygen Induction Type: IV induction Ventilation: Mask ventilation without difficulty LMA: LMA inserted LMA Size: 4.0 Number of attempts: 1 Placement Confirmation: positive ETCO2 and breath sounds checked- equal and bilateral

## 2017-11-08 NOTE — Anesthesia Preprocedure Evaluation (Signed)
Anesthesia Evaluation  Patient identified by MRN, date of birth, ID band Patient awake    Reviewed: Allergy & Precautions, H&P , NPO status , Patient's Chart, lab work & pertinent test results, reviewed documented beta blocker date and time   Airway Mallampati: II  TM Distance: >3 FB Neck ROM: full    Dental no notable dental hx.    Pulmonary neg pulmonary ROS,    Pulmonary exam normal breath sounds clear to auscultation       Cardiovascular Exercise Tolerance: Good negative cardio ROS   Rhythm:regular Rate:Normal     Neuro/Psych negative neurological ROS  negative psych ROS   GI/Hepatic negative GI ROS, Neg liver ROS,   Endo/Other  negative endocrine ROSdiabetes, Well Controlled, Type 2  Renal/GU negative Renal ROS  negative genitourinary   Musculoskeletal   Abdominal   Peds  Hematology negative hematology ROS (+)   Anesthesia Other Findings Healthy appearing, trim and pleasant gentleman. 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 Denies any other medical hx.  Reproductive/Obstetrics negative OB ROS                             Anesthesia Physical Anesthesia Plan  ASA: II  Anesthesia Plan: General   Post-op Pain Management:    Induction:   PONV Risk Score and Plan: Ondansetron  Airway Management Planned:   Additional Equipment:   Intra-op Plan:   Post-operative Plan:   Informed Consent: I have reviewed the patients History and Physical, chart, labs and discussed the procedure including the risks, benefits and alternatives for the proposed anesthesia with the patient or authorized representative who has indicated his/her understanding and acceptance.   Dental Advisory Given  Plan Discussed with: CRNA and Anesthesiologist  Anesthesia Plan Comments:         Anesthesia Quick Evaluation

## 2017-11-08 NOTE — Interval H&P Note (Signed)
History and Physical Interval Note:  11/08/2017 7:21 AM  Cory Morales  has presented today for surgery, with the diagnosis of right inguinal hernia  The various methods of treatment have been discussed with the patient and family. After consideration of risks, benefits and other options for treatment, the patient has consented to  Procedure(s): HERNIA REPAIR INGUINAL ADULT WITH MESH (Right) as a surgical intervention .  The patient's history has been reviewed, patient examined, no change in status, stable for surgery.  I have reviewed the patient's chart and labs.  Questions were answered to the patient's satisfaction.    No additional questions. Right side marked.   [name]

## 2017-11-08 NOTE — Op Note (Signed)
[facility] Operative Note  11/08/17  Preoperative Diagnosis: Right inguinal hernia    Postoperative Diagnosis: Same   Procedure(s) Performed: Right inguinal hernia repair with mesh   Surgeon: Lanell Matar. Constance Haw, MD   Assistants: No qualified resident was available   Anesthesia: General endotracheal   Anesthesiologist: Dr. Currie Paris, MD    Specimens:  Cord lipoma    Estimated Blood Loss: Minimal   Blood Replacement: None    Complications: None   Wound Class: Clean    Operative Indications: Mr. Mastrangelo is a 57 yo with newly diagnosed diabetes who has been having pain and swelling of the right inguinal region and was found to have a right inguinal hernia.  He saw me in clinic to discuss repair, and after a discussion of the risk and benefits including but not limited to bleeding, infection, numbness/ nerve injury, testicle /cord injury, hernia recurence, he opted to proceed.   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 General endotracheal anesthesia was induced. Intravenous antibiotics were administered per protocol.  A time out was preformed verifying the correct patient, procedure, site, positioning and implants.  The right groin and scrotum were prepared and draped in the usual sterile fashion.   An incision was made in a natural skin crease between the pubic tubercle and the anterior superior iliac spine.  The incision was deepened with electrocautery through Scarpa's and Camper's fascia until the aponeurosis of the external oblique was encountered.  This was cleaned and the external ring was exposed.  An incision was made in the midportion of the external oblique aponeurosis in the direction of its fibers. The ilioinguinal nerve was identified and was protected throughout the dissection.  Flaps of the external oblique were developed  cephalad and inferiorly.    The cord was identified and it was gently dissented free at the pubic tubercle and encircled with a Penrose drain.  Attention was then directed at the anteromedial aspect of the cord, where an indirect hernia sac was identified.  The sac was carefully dissected free from the cord down to the level of the internal ring.  The vas and testicular vessels were identified and protected from harm.  A lage cord lipoma was removed and sent to pathology. Once the sac was dissected free from the cords, the Penrose was placed around the cord which was retracted inferiorly out of the field of view.  The hernia was reduced into the internal ring without difficulty.  Two Large Perfix Plugs were placed into the defect and filled the space.  Attention was then turned to the floor of the canal, which was grossly weakened without any defined defect or sac.  The Perfix Mesh Patch was sutured to the inguinal ligament inferiorly starting at the pubic tubercle using 2-0 Novafil interrupted sutures.  The mesh was sutured superiorly to the conjoint tendon using 2-0 Novafil interrupted sutures.  Care was taken to ensure the mesh was placed in a relaxed fashion to avoid excessive tension and no neurovascular structures were caught in the repair.  Laterally the tails of the mesh were crossed and the internal ring was recreated, allowing for passage of cords without tension.   Hemostasis was adequate.  The Penrose was removed.  The external oblique aponeurosis was closed with a 2-0 Vicryl suture in a running fashion, taking care to not catch the ilioinguinal nerve in the suture line.  Scarpa's fashion was closed with 3-0 Vicryl interrupted sutures. The skin was closed with a subcuticular 4-0 Monocryl suture.  Dermabond was applied.   The testis was gently pulled down into its anatomic position in the scrotum.  The patient tolerated the procedure well and was taken to the PACU in stable condition. All counts  were correct at the end of the case.        Curlene Labrum, MD [facility] [address] [place], [place] [address] [phone] (office)

## 2017-11-08 NOTE — Discharge Instructions (Signed)
Discharge Instructions: Shower per your regular routine. No submerging in the bath.  Take tylenol and ibuprofen as needed for pain control, alternating every 4-6 hours.  Take Roxicodone for breakthrough pain. Take colace for constipation related to narcotic pain medication. Do not pick at the dermabond glue on your incision site.  Ice to the area/ scrotum as needed. No heavy lifting > 10 lbs, excessive squatting, bending, pulling for 8 weeks following surgery.  Scrotal evaluation to help with swelling.   Open Hernia Repair, Adult, Care After These instructions give you information about caring for yourself after your procedure. Your doctor may also give you more specific instructions. If you have problems or questions, contact your doctor. Follow these instructions at home: Surgical cut (incision) care   Follow instructions from your doctor about how to take care of your surgical cut area. Make sure you: ? Wash your hands with soap and water before you change your bandage (dressing). If you cannot use soap and water, use hand sanitizer. ? Change your bandage as told by your doctor. ? Leave stitches (sutures), skin glue, or skin tape (adhesive) strips in place. They may need to stay in place for 2 weeks or longer. If tape strips get loose and curl up, you may trim the loose edges. Do not remove tape strips completely unless your doctor says it is okay.  Check your surgical cut every day for signs of infection. Check for: ? More redness, swelling, or pain. ? More fluid or blood. ? Warmth. ? Pus or a bad smell. Activity  Do not drive or use heavy machinery while taking prescription pain medicine. Do not drive until your doctor says it is okay.  Until your doctor says it is okay: ? Do not lift anything that is heavier than 10 lb (4.5 kg). ? Do not play contact sports.  Return to your normal activities as told by your doctor. Ask your doctor what activities are safe. General  instructions  To prevent or treat having a hard time pooping (constipation) while you are taking prescription pain medicine, your doctor may recommend that you: ? Drink enough fluid to keep your pee (urine) clear or pale yellow. ? Take over-the-counter or prescription medicines. ? Eat foods that are high in fiber, such as fresh fruits and vegetables, whole grains, and beans. ? Limit foods that are high in fat and processed sugars, such as fried and sweet foods.  Take over-the-counter and prescription medicines only as told by your doctor.  Do not take baths, swim, or use a hot tub until your doctor says it is okay.  Keep all follow-up visits as told by your doctor. This is important. Contact a doctor if:  You develop a rash.  You have more redness, swelling, or pain around your surgical cut.  You have more fluid or blood coming from your surgical cut.  Your surgical cut feels warm to the touch.  You have pus or a bad smell coming from your surgical cut.  You have a fever or chills.  You have blood in your poop (stool).  You have not pooped in 2-3 days.  Medicine does not help your pain. Get help right away if:  You have chest pain or you are short of breath.  You feel light-headed.  You feel weak and dizzy (feel faint).  You have very bad pain.  You throw up (vomit) and your pain is worse. This information is not intended to replace advice given to you by  your health care provider. Make sure you discuss any questions you have with your health care provider. Document Released: 06/08/2014 Document Revised: 12/06/2015 Document Reviewed: 10/30/2015 Elsevier Interactive Patient Education  2018 [place] POST-ANESTHESIA  IMMEDIATELY FOLLOWING SURGERY:  Do not drive or operate machinery for the first twenty four hours after surgery.  Do not make any important decisions for twenty four hours after surgery or while taking narcotic pain medications or  sedatives.  If you develop intractable nausea and vomiting or a severe headache please notify your doctor immediately.  FOLLOW-UP:  Please make an appointment with your surgeon as instructed. You do not need to follow up with anesthesia unless specifically instructed to do so.  WOUND CARE INSTRUCTIONS (if applicable):  Keep a dry clean dressing on the anesthesia/puncture wound site if there is drainage.  Once the wound has quit draining you may leave it open to air.  Generally you should leave the bandage intact for twenty four hours unless there is drainage.  If the epidural site drains for more than 36-48 hours please call the anesthesia department.  QUESTIONS?:  Please feel free to call your physician or the hospital operator if you have any questions, and they will be happy to assist you.

## 2017-11-08 NOTE — Anesthesia Postprocedure Evaluation (Signed)
Anesthesia Post Note  Patient: Cory Morales  Procedure(s) Performed: HERNIA REPAIR INGUINAL ADULT WITH MESH (Right )  Patient location during evaluation: PACU Anesthesia Type: General Level of consciousness: awake and alert and oriented Pain management: pain level controlled Vital Signs Assessment: post-procedure vital signs reviewed and stable Respiratory status: spontaneous breathing Cardiovascular status: stable Postop Assessment: no apparent nausea or vomiting Anesthetic complications: no     Last Vitals:  Vitals:   11/08/17 1001 11/08/17 1012  BP: (!) 145/94 (!) 166/84  Pulse: 73 67  Resp: 15 18  Temp:  36.6 C  SpO2: 95% 95%    Last Pain:  Vitals:   11/08/17 1012  TempSrc: Oral  PainSc: 2                  Alexandros Ewan

## 2017-11-09 ENCOUNTER — Encounter (HOSPITAL_COMMUNITY): Payer: Self-pay | Admitting: General Surgery

## 2017-11-16 ENCOUNTER — Encounter: Payer: Self-pay | Admitting: Family Medicine

## 2017-11-16 ENCOUNTER — Encounter: Payer: Self-pay | Admitting: General Surgery

## 2017-11-16 ENCOUNTER — Ambulatory Visit (INDEPENDENT_AMBULATORY_CARE_PROVIDER_SITE_OTHER): Payer: Self-pay | Admitting: General Surgery

## 2017-11-16 ENCOUNTER — Ambulatory Visit (INDEPENDENT_AMBULATORY_CARE_PROVIDER_SITE_OTHER): Payer: Commercial Managed Care - PPO | Admitting: Family Medicine

## 2017-11-16 VITALS — BP 122/69 | HR 72 | Temp 98.3°F | Ht 67.0 in | Wt 164.4 lb

## 2017-11-16 VITALS — BP 118/72 | HR 73 | Temp 97.8°F | Resp 18 | Wt 167.0 lb

## 2017-11-16 DIAGNOSIS — E119 Type 2 diabetes mellitus without complications: Secondary | ICD-10-CM

## 2017-11-16 DIAGNOSIS — K4090 Unilateral inguinal hernia, without obstruction or gangrene, not specified as recurrent: Secondary | ICD-10-CM

## 2017-11-16 MED ORDER — METFORMIN HCL ER 750 MG PO TB24: 750.0000 mg | ORAL_TABLET | Freq: Two times a day (BID) | ORAL | 2 refills | Status: DC

## 2017-11-16 NOTE — Patient Instructions (Signed)

## 2017-11-16 NOTE — Patient Instructions (Signed)
No heavy lifting >10 lbs for 6-8 weeks after surgery. No excessive bending, squatting, pushing/ pulling.

## 2017-11-16 NOTE — Progress Notes (Signed)
Subjective:  Patient ID: Cory Morales, male    DOB: 1960/06/03  Age: 57 y.o. MRN: [id number]  CC: Diabetes   HPI XAVIOUS SHARRAR presents forFollow-up of diabetes. Patient checks blood sugar at home.   118 fasting to 180 postprandial Patient denies symptoms such as polyuria, polydipsia, excessive hunger, nausea No significant hypoglycemic spells noted. Medications reviewed. Pt reports taking them regularly but he is having except with numbers of bowel movements.  Up to 4 5 a day. History Greely has a past medical history of Diabetes mellitus without complication ([place]).   He has a past surgical history that includes Inguinal hernia repair (Right, 11/08/2017).   His family history includes COPD in his mother; Diabetes in his mother; Heart disease in his mother.He reports that he has never smoked. He has never used smokeless tobacco. He reports that he does not drink alcohol or use drugs.  Current Outpatient Medications on File Prior to Visit  Medication Sig Dispense Refill  . Blood Glucose Monitoring Suppl (BLOOD GLUCOSE MONITOR SYSTEM) w/Device KIT 1 Device by Does not apply route 2 (two) times daily. 1 each 0  . docusate sodium (COLACE) 100 MG capsule Take 1 capsule (100 mg total) by mouth 2 (two) times daily. 60 capsule 2  . glucose blood (IGLUCOSE TEST STRIPS) test strip Use as instructed 100 each 3  . Lancets Misc. MISC Use to check blood sugars 100 each 3  . oxyCODONE (ROXICODONE) 5 MG immediate release tablet Take 1 tablet (5 mg total) by mouth every 4 (four) hours as needed for severe pain or breakthrough pain. 30 tablet 0   No current facility-administered medications on file prior to visit.     ROS Review of Systems  Constitutional: Negative for fever.  Respiratory: Negative for shortness of breath.   Cardiovascular: Negative for chest pain.  Musculoskeletal: Negative for arthralgias.  Skin: Negative for rash.    Objective:  BP 122/69   Pulse 72   Temp 98.3 F  (36.8 C) (Oral)   Ht 5' 7"  (1.702 m)   Wt 164 lb 6 oz (74.6 kg)   BMI 25.74 kg/m   BP Readings from Last 3 Encounters:  11/16/17 122/69  11/08/17 (!) 166/84  10/29/17 122/86    Wt Readings from Last 3 Encounters:  11/16/17 164 lb 6 oz (74.6 kg)  10/29/17 166 lb (75.3 kg)  10/05/17 168 lb 6 oz (76.4 kg)     Physical Exam  Constitutional: He is oriented to person, place, and time. He appears well-developed and well-nourished.  HENT:  Head: Normocephalic and atraumatic.  Right Ear: External ear normal.  Left Ear: External ear normal.  Mouth/Throat: No oropharyngeal exudate or posterior oropharyngeal erythema.  Eyes: Pupils are equal, round, and reactive to light.  Neck: Normal range of motion. Neck supple.  Cardiovascular: Normal rate and regular rhythm.  No murmur heard. Pulmonary/Chest: Breath sounds normal. No respiratory distress.  Neurological: He is alert and oriented to person, place, and time.  Vitals reviewed.   Results for orders placed or performed during the hospital encounter of 11/08/17  Glucose, capillary  Result Value Ref Range   Glucose-Capillary 182 (H) 65 - 99 mg/dL  Glucose, capillary  Result Value Ref Range   Glucose-Capillary 183 (H) 65 - 99 mg/dL   A1c done preop on May 31 equals 8.1  Assessment & Plan:   Siddhant was seen today for diabetes.  Diagnoses and all orders for this visit:  Type 2 diabetes mellitus without  complication, without long-term current use of insulin (HCC)  Other orders -     metFORMIN (GLUCOPHAGE XR) 750 MG 24 hr tablet; Take 1 tablet (750 mg total) by mouth 2 (two) times daily.   We will have the patient try the extended release Glucophage and increase the dose so that he can get better control and not have the GI problems with frequent stool.   I have discontinued Damante L. Ray's metFORMIN. I am also having him start on metFORMIN. Additionally, I am having him maintain his Blood Glucose Monitor System, Lancets  Misc., glucose blood, oxyCODONE, and docusate sodium.  Meds ordered this encounter  Medications  . metFORMIN (GLUCOPHAGE XR) 750 MG 24 hr tablet    Sig: Take 1 tablet (750 mg total) by mouth 2 (two) times daily.    Dispense:  60 tablet    Refill:  2     Follow-up: Return in about 3 months (around 02/16/2018).  Claretta Fraise, M.D.

## 2017-11-17 NOTE — Progress Notes (Signed)
[facility] Note   HPI:  56 y.o. Male presents to clinic for post-op follow-up evaluation after a right inguinal hernia repair. Patient reports he is doing well and has no complaints. He is working from home and feeling good.  Review of Systems:  No fevers or chills Numbness in leg resolved Pain improving  All other review of systems: otherwise negative   Vital Signs:  BP 118/72 (BP Location: Left Arm, Patient Position: Sitting, Cuff Size: Normal)   Pulse 73   Temp 97.8 F (36.6 C) (Temporal)   Resp 18   Wt 167 lb (75.8 kg)   BMI 26.16 kg/m    Physical Exam:  Physical Exam  Constitutional: He appears well-developed.  HENT:  Head: Normocephalic.  Eyes: Pupils are equal, round, and reactive to light.  Neck: Normal range of motion.  Abdominal: Soft. He exhibits no distension. There is no tenderness.  Well healed right groin incision with peeling dermabond, no erythema or drainage, some induration, no obvious recurrence    Laboratory studies: None   Imaging:  None    Assessment:  57 y.o. yo Male s/p open right inguinal hernia repair with mesh. Doing well.  Plan:  -  No heavy lifting >10 lbs for 6-8 weeks after surgery. No excessive bending, squatting, pushing/ pulling.  - Ok to go back to work as tolerated with above restrictions   All of the above recommendations were discussed with the patient and patient's family, and all of patient's and family's questions were answered to their expressed satisfaction.  Curlene Labrum, MD [facility] [address] [place],  [id number] [phone] (office)

## 2018-02-16 ENCOUNTER — Ambulatory Visit: Payer: Commercial Managed Care - PPO | Admitting: Family Medicine

## 2018-02-21 ENCOUNTER — Other Ambulatory Visit: Payer: Self-pay | Admitting: Family Medicine

## 2018-02-22 ENCOUNTER — Telehealth: Payer: Self-pay | Admitting: Family Medicine

## 2018-02-22 NOTE — Telephone Encounter (Signed)
Left detailed message for pt regarding refills sent to [facility]

## 2018-04-05 ENCOUNTER — Encounter: Payer: Self-pay | Admitting: Family Medicine

## 2018-04-05 ENCOUNTER — Ambulatory Visit (INDEPENDENT_AMBULATORY_CARE_PROVIDER_SITE_OTHER): Payer: Commercial Managed Care - PPO | Admitting: Family Medicine

## 2018-04-05 VITALS — BP 134/87 | HR 75 | Temp 98.1°F | Wt 166.0 lb

## 2018-04-05 DIAGNOSIS — E119 Type 2 diabetes mellitus without complications: Secondary | ICD-10-CM | POA: Diagnosis not present

## 2018-04-05 LAB — CMP14+EGFR
ALBUMIN: 4.3 g/dL (ref 3.5–5.5)
ALT: 16 IU/L (ref 0–44)
AST: 12 IU/L (ref 0–40)
Albumin/Globulin Ratio: 1.7 (ref 1.2–2.2)
Alkaline Phosphatase: 74 IU/L (ref 39–117)
BUN/Creatinine Ratio: 17 (ref 9–20)
BUN: 15 mg/dL (ref 6–24)
Bilirubin Total: <0.2 mg/dL (ref 0.0–1.2)
CALCIUM: 9.5 mg/dL (ref 8.7–10.2)
CO2: 22 mmol/L (ref 20–29)
CREATININE: 0.9 mg/dL (ref 0.76–1.27)
Chloride: 103 mmol/L (ref 96–106)
GFR calc Af Amer: 108 mL/min/{1.73_m2} (ref >59)
GFR, EST NON AFRICAN AMERICAN: 94 mL/min/{1.73_m2} (ref >59)
GLOBULIN, TOTAL: 2.5 g/dL (ref 1.5–4.5)
Glucose: 143 mg/dL — ABNORMAL HIGH (ref 65–99)
Potassium: 4.5 mmol/L (ref 3.5–5.2)
Sodium: 138 mmol/L (ref 134–144)
Total Protein: 6.8 g/dL (ref 6.0–8.5)

## 2018-04-05 LAB — BAYER DCA HB A1C WAIVED: HB A1C: 7 % — AB (ref <7.0)

## 2018-04-05 LAB — LIPID PANEL
CHOL/HDL RATIO: 3.8 ratio (ref 0.0–5.0)
Cholesterol, Total: 188 mg/dL (ref 100–199)
HDL: 49 mg/dL (ref >39)
LDL CALC: 123 mg/dL — AB (ref 0–99)
Triglycerides: 78 mg/dL (ref 0–149)
VLDL CHOLESTEROL CAL: 16 mg/dL (ref 5–40)

## 2018-04-05 MED ORDER — LISINOPRIL 10 MG PO TABS: 10.0000 mg | ORAL_TABLET | Freq: Every day | ORAL | 3 refills | Status: DC

## 2018-04-05 MED ORDER — METFORMIN HCL ER 750 MG PO TB24: 750.0000 mg | ORAL_TABLET | Freq: Two times a day (BID) | ORAL | 1 refills | Status: DC

## 2018-04-05 MED ORDER — GLUCOSE BLOOD VI STRP: ORAL_STRIP | 3 refills | Status: DC

## 2018-04-05 MED ORDER — LANCETS MISC. MISC: 3 refills | Status: AC

## 2018-04-05 NOTE — Progress Notes (Signed)
Subjective:  Patient ID: Cory Morales, male    DOB: 1960-08-01  Age: 57 y.o. MRN: [id number]  CC: Medical Management of Chronic Issues   HPI Cory Morales presents forFollow-up of diabetes. Patient checks blood sugar at home.  He brings in his average readings.  For July the average fasting was 152 postprandial 129.  For August fasting average 140 postprandial 147.  September fasting 154 postprandial 138.  October, 133.  For the first few days of November his average was 164 Patient denies symptoms such as polyuria, polydipsia, excessive hunger, nausea No significant hypoglycemic spells noted. Medications reviewed. Pt reports taking them regularly without complication/adverse reaction being reported today.  Checking feet daily.   History Cory Morales has a past medical history of Diabetes mellitus without complication ([place]).   He has a past surgical history that includes Inguinal hernia repair (Right, 11/08/2017).   His family history includes COPD in his mother; Diabetes in his mother; Heart disease in his mother.He reports that he has never smoked. He has never used smokeless tobacco. He reports that he does not drink alcohol or use drugs.  Current Outpatient Medications on File Prior to Visit  Medication Sig Dispense Refill  . acetaminophen (TYLENOL) 325 MG tablet Take 650 mg by mouth every 6 (six) hours as needed.    . Blood Glucose Monitoring Suppl (BLOOD GLUCOSE MONITOR SYSTEM) w/Device KIT 1 Device by Does not apply route 2 (two) times daily. 1 each 0  . ibuprofen (ADVIL,MOTRIN) 200 MG tablet Take 200 mg by mouth every 6 (six) hours as needed.     No current facility-administered medications on file prior to visit.     ROS Review of Systems  Constitutional: Negative.   HENT: Negative.   Eyes: Negative for visual disturbance.  Respiratory: Negative for cough and shortness of breath.   Cardiovascular: Negative for chest pain and leg swelling.  Gastrointestinal: Negative  for abdominal pain, diarrhea, nausea and vomiting.  Genitourinary: Negative for difficulty urinating.  Musculoskeletal: Negative for arthralgias and myalgias.  Skin: Negative for rash.  Neurological: Negative for headaches.  Psychiatric/Behavioral: Negative for sleep disturbance.    Objective:  BP 134/87 (BP Location: Left Arm)   Pulse 75   Temp 98.1 F (36.7 C) (Oral)   Wt 166 lb (75.3 kg)   BMI 26.00 kg/m   BP Readings from Last 3 Encounters:  04/05/18 134/87  11/16/17 118/72  11/16/17 122/69    Wt Readings from Last 3 Encounters:  04/05/18 166 lb (75.3 kg)  11/16/17 167 lb (75.8 kg)  11/16/17 164 lb 6 oz (74.6 kg)     Physical Exam  Constitutional: He is oriented to person, place, and time. He appears well-developed and well-nourished. No distress.  HENT:  Head: Normocephalic and atraumatic.  Right Ear: External ear normal.  Left Ear: External ear normal.  Nose: Nose normal.  Mouth/Throat: Oropharynx is clear and moist.  Eyes: Pupils are equal, round, and reactive to light. Conjunctivae and EOM are normal.  Neck: Normal range of motion. Neck supple.  Cardiovascular: Normal rate, regular rhythm and normal heart sounds.  No murmur heard. Pulmonary/Chest: Effort normal and breath sounds normal. No respiratory distress. He has no wheezes. He has no rales.  Abdominal: Soft. There is no tenderness.  Musculoskeletal: Normal range of motion.  Neurological: He is alert and oriented to person, place, and time. He has normal reflexes.  Skin: Skin is warm and dry.  Psychiatric: He has a normal mood and affect. His  behavior is normal. Judgment and thought content normal.      Assessment & Plan:   Cory Morales was seen today for medical management of chronic issues.  Diagnoses and all orders for this visit:  Type 2 diabetes mellitus without complication, without long-term current use of insulin (HCC) -     CMP14+EGFR -     Lipid panel -     Bayer DCA Hb A1c Waived  Other  orders -     lisinopril (PRINIVIL,ZESTRIL) 10 MG tablet; Take 1 tablet (10 mg total) by mouth daily. -     glucose blood (IGLUCOSE TEST STRIPS) test strip; Use as instructed -     Lancets Misc. MISC; Use to check blood sugars -     metFORMIN (GLUCOPHAGE-XR) 750 MG 24 hr tablet; Take 1 tablet (750 mg total) by mouth 2 (two) times daily.      I have discontinued Cory Morales's oxyCODONE and docusate sodium. I am also having him start on lisinopril. Additionally, I am having him maintain his Blood Glucose Monitor System, acetaminophen, ibuprofen, glucose blood, Lancets Misc., and metFORMIN.  Meds ordered this encounter  Medications  . lisinopril (PRINIVIL,ZESTRIL) 10 MG tablet    Sig: Take 1 tablet (10 mg total) by mouth daily.    Dispense:  90 tablet    Refill:  3  . glucose blood (IGLUCOSE TEST STRIPS) test strip    Sig: Use as instructed    Dispense:  100 each    Refill:  3  . Lancets Misc. MISC    Sig: Use to check blood sugars    Dispense:  100 each    Refill:  3  . metFORMIN (GLUCOPHAGE-XR) 750 MG 24 hr tablet    Sig: Take 1 tablet (750 mg total) by mouth 2 (two) times daily.    Dispense:  180 tablet    Refill:  1    $     Follow-up: Return in about 3 months (around 07/06/2018).  Claretta Fraise, M.D.

## 2018-04-08 ENCOUNTER — Other Ambulatory Visit: Payer: Self-pay

## 2018-04-08 DIAGNOSIS — Z1211 Encounter for screening for malignant neoplasm of colon: Secondary | ICD-10-CM

## 2018-04-11 ENCOUNTER — Encounter (INDEPENDENT_AMBULATORY_CARE_PROVIDER_SITE_OTHER): Payer: Self-pay | Admitting: *Deleted

## 2018-07-08 ENCOUNTER — Ambulatory Visit (INDEPENDENT_AMBULATORY_CARE_PROVIDER_SITE_OTHER): Payer: Commercial Managed Care - PPO | Admitting: Family

## 2018-07-08 ENCOUNTER — Encounter: Payer: Self-pay | Admitting: Family Medicine

## 2018-07-08 VITALS — BP 117/73 | HR 78 | Temp 99.1°F | Ht 67.0 in | Wt 170.5 lb

## 2018-07-08 DIAGNOSIS — B351 Tinea unguium: Secondary | ICD-10-CM | POA: Diagnosis not present

## 2018-07-08 DIAGNOSIS — E119 Type 2 diabetes mellitus without complications: Secondary | ICD-10-CM | POA: Diagnosis not present

## 2018-07-08 LAB — BAYER DCA HB A1C WAIVED: HB A1C (BAYER DCA - WAIVED): 7.5 % — ABNORMAL HIGH (ref <7.0)

## 2018-07-08 MED ORDER — METFORMIN HCL ER 750 MG PO TB24: 750.0000 mg | ORAL_TABLET | Freq: Two times a day (BID) | ORAL | 1 refills | Status: DC

## 2018-07-08 MED ORDER — TERBINAFINE HCL 250 MG PO TABS: 250.0000 mg | ORAL_TABLET | Freq: Every day | ORAL | 0 refills | Status: DC

## 2018-07-08 MED ORDER — LISINOPRIL 10 MG PO TABS: 10.0000 mg | ORAL_TABLET | Freq: Every day | ORAL | 3 refills | Status: DC

## 2018-07-08 NOTE — Progress Notes (Signed)
Subjective:    Patient ID: Cory Morales, male    DOB: August 07, 1960, 58 y.o.   MRN: [id number]  Chief Complaint  Patient presents with  . Medical Management of Chronic Issues    Diabetes  He presents for his follow-up diabetic visit. He has type 2 diabetes mellitus. His disease course has been stable. There are no hypoglycemic associated symptoms. Pertinent negatives for diabetes include no blurred vision, no foot paresthesias and no visual change. Symptoms are stable. Pertinent negatives for diabetic complications include no CVA, heart disease or nephropathy. Risk factors for coronary artery disease include dyslipidemia. He is following a generally healthy diet. His overall blood glucose range is 140-180 mg/dl. Eye exam is current.      Review of Systems  Eyes: Negative for blurred vision.  All other systems reviewed and are negative.      Objective:   Physical Exam Vitals signs reviewed.  Constitutional:      General: He is not in acute distress.    Appearance: He is well-developed.  HENT:     Head: Normocephalic.     Right Ear: Tympanic membrane normal.     Left Ear: Tympanic membrane normal.  Eyes:     General:        Right eye: No discharge.        Left eye: No discharge.     Pupils: Pupils are equal, round, and reactive to light.  Neck:     Musculoskeletal: Normal range of motion and neck supple.     Thyroid: No thyromegaly.  Cardiovascular:     Rate and Rhythm: Normal rate and regular rhythm.     Heart sounds: Normal heart sounds. No murmur.  Pulmonary:     Effort: Pulmonary effort is normal. No respiratory distress.     Breath sounds: Normal breath sounds. No wheezing.  Abdominal:     General: Bowel sounds are normal. There is no distension.     Palpations: Abdomen is soft.     Tenderness: There is no abdominal tenderness.  Musculoskeletal: Normal range of motion.        General: No tenderness.  Skin:    General: Skin is warm and dry.     Findings: No  erythema or rash.     Comments: Right great toenail thickness and discoloration   Neurological:     Mental Status: He is alert and oriented to person, place, and time.     Cranial Nerves: No cranial nerve deficit.     Deep Tendon Reflexes: Reflexes are normal and symmetric.  Psychiatric:        Behavior: Behavior normal.        Thought Content: Thought content normal.        Judgment: Judgment normal.     Diabetic Foot Exam - Simple   Simple Foot Form Diabetic Foot exam was performed with the following findings:  Yes 07/08/2018  8:34 AM  Visual Inspection No deformities, no ulcerations, no other skin breakdown bilaterally:  Yes Sensation Testing Intact to touch and monofilament testing bilaterally:  Yes Pulse Check Posterior Tibialis and Dorsalis pulse intact bilaterally:  Yes Comments      BP 117/73   Pulse 78   Temp 99.1 F (37.3 C) (Oral)   Ht 5' 7"  (1.702 m)   Wt 170 lb 8 oz (77.3 kg)   BMI 26.70 kg/m      Assessment & Plan:  Cory Morales comes in today with chief complaint of Medical  Management of Chronic Issues   Diagnosis and orders addressed:  1. Newly diagnosed diabetes ([place]) Strict low carb diet - CMP14+EGFR - CBC with Differential/Platelet - Bayer DCA Hb A1c Waived - Microalbumin / creatinine urine ratio - metFORMIN (GLUCOPHAGE-XR) 750 MG 24 hr tablet; Take 1 tablet (750 mg total) by mouth 2 (two) times daily.  Dispense: 180 tablet; Refill: 1 - lisinopril (PRINIVIL,ZESTRIL) 10 MG tablet; Take 1 tablet (10 mg total) by mouth daily.  Dispense: 90 tablet; Refill: 3  2. Onychomycosis Liver enzymes normal, keep follow up appt to recheck Keep clean and dry Do not walk around barefoot - CMP14+EGFR - CBC with Differential/Platelet - terbinafine (LAMISIL) 250 MG tablet; Take 1 tablet (250 mg total) by mouth daily.  Dispense: 90 tablet; Refill: 0   Labs pending Health Maintenance reviewed- He will call and get colonoscopy scheduled. Diet and exercise  encouraged  Follow up plan: 6 months with PCP   Evelina Dun, FNP

## 2018-07-08 NOTE — Patient Instructions (Signed)

## 2018-07-09 LAB — CBC WITH DIFFERENTIAL/PLATELET
BASOS ABS: 0.1 10*3/uL (ref 0.0–0.2)
Basos: 1 %
EOS (ABSOLUTE): 0.2 10*3/uL (ref 0.0–0.4)
Eos: 3 %
HEMOGLOBIN: 14 g/dL (ref 13.0–17.7)
Hematocrit: 42.1 % (ref 37.5–51.0)
IMMATURE GRANS (ABS): 0 10*3/uL (ref 0.0–0.1)
Immature Granulocytes: 1 %
LYMPHS ABS: 1.4 10*3/uL (ref 0.7–3.1)
LYMPHS: 20 %
MCH: 28.5 pg (ref 26.6–33.0)
MCHC: 33.3 g/dL (ref 31.5–35.7)
MCV: 86 fL (ref 79–97)
MONOCYTES: 7 %
Monocytes Absolute: 0.5 10*3/uL (ref 0.1–0.9)
NEUTROS ABS: 4.9 10*3/uL (ref 1.4–7.0)
Neutrophils: 68 %
PLATELETS: 357 10*3/uL (ref 150–450)
RBC: 4.91 x10E6/uL (ref 4.14–5.80)
RDW: 12.1 % (ref 11.6–15.4)
WBC: 7.2 10*3/uL (ref 3.4–10.8)

## 2018-07-09 LAB — MICROALBUMIN / CREATININE URINE RATIO
Creatinine, Urine: 42.4 mg/dL
Microalbumin, Urine: <3 ug/mL

## 2018-07-09 LAB — CMP14+EGFR
A/G RATIO: 1.8 (ref 1.2–2.2)
ALK PHOS: 63 IU/L (ref 39–117)
ALT: 17 IU/L (ref 0–44)
AST: 15 IU/L (ref 0–40)
Albumin: 4.5 g/dL (ref 3.8–4.9)
BILIRUBIN TOTAL: 0.3 mg/dL (ref 0.0–1.2)
BUN/Creatinine Ratio: 15 (ref 9–20)
BUN: 15 mg/dL (ref 6–24)
CHLORIDE: 99 mmol/L (ref 96–106)
CO2: 23 mmol/L (ref 20–29)
Calcium: 9.7 mg/dL (ref 8.7–10.2)
Creatinine, Ser: 0.98 mg/dL (ref 0.76–1.27)
GFR calc Af Amer: 98 mL/min/{1.73_m2} (ref >59)
GFR calc non Af Amer: 85 mL/min/{1.73_m2} (ref >59)
GLUCOSE: 143 mg/dL — AB (ref 65–99)
Globulin, Total: 2.5 g/dL (ref 1.5–4.5)
POTASSIUM: 5.5 mmol/L — AB (ref 3.5–5.2)
Sodium: 136 mmol/L (ref 134–144)
Total Protein: 7 g/dL (ref 6.0–8.5)

## 2018-07-11 ENCOUNTER — Other Ambulatory Visit: Payer: Self-pay | Admitting: Family

## 2018-07-11 DIAGNOSIS — E875 Hyperkalemia: Secondary | ICD-10-CM

## 2018-10-14 DIAGNOSIS — S02621A Fracture of subcondylar process of right mandible, initial encounter for closed fracture: Secondary | ICD-10-CM | POA: Diagnosis not present

## 2018-10-14 DIAGNOSIS — S0269XA Fracture of mandible of other specified site, initial encounter for closed fracture: Secondary | ICD-10-CM | POA: Diagnosis not present

## 2018-10-14 MED ORDER — CALCIUM-VITAMIN D-VITAMIN K 600-1000-90 MG-UNT-MCG PO TABS
25.00 | ORAL_TABLET | ORAL | Status: DC
Start: ? — End: 2018-10-14

## 2018-10-14 MED ORDER — OXYCODONE HCL 5 MG/5ML PO SOLN
5.00 | ORAL | Status: DC
Start: ? — End: 2018-10-14

## 2018-10-14 MED ORDER — Medication
6.25 | Status: DC
Start: ? — End: 2018-10-14

## 2018-10-14 MED ORDER — ONDANSETRON HCL 4 MG/2ML IJ SOLN
4.00 | INTRAMUSCULAR | Status: DC
Start: ? — End: 2018-10-14

## 2018-10-27 DIAGNOSIS — S0993XA Unspecified injury of face, initial encounter: Secondary | ICD-10-CM | POA: Insufficient documentation

## 2019-05-11 ENCOUNTER — Other Ambulatory Visit: Payer: Self-pay

## 2019-05-11 DIAGNOSIS — Z20822 Contact with and (suspected) exposure to covid-19: Secondary | ICD-10-CM

## 2019-05-12 LAB — NOVEL CORONAVIRUS, NAA: SARS-CoV-2, NAA: NOT DETECTED

## 2019-05-20 ENCOUNTER — Other Ambulatory Visit: Payer: Self-pay | Admitting: Family Medicine

## 2019-05-20 ENCOUNTER — Other Ambulatory Visit: Payer: Self-pay | Admitting: Family

## 2019-05-20 DIAGNOSIS — E119 Type 2 diabetes mellitus without complications: Secondary | ICD-10-CM

## 2019-06-14 ENCOUNTER — Other Ambulatory Visit: Payer: Self-pay

## 2019-06-15 ENCOUNTER — Encounter: Payer: Self-pay | Admitting: Family Medicine

## 2019-06-15 ENCOUNTER — Ambulatory Visit (INDEPENDENT_AMBULATORY_CARE_PROVIDER_SITE_OTHER): Payer: Commercial Managed Care - PPO | Admitting: Family Medicine

## 2019-06-15 VITALS — BP 134/86 | HR 75 | Temp 99.1°F | Ht 67.0 in | Wt 170.6 lb

## 2019-06-15 DIAGNOSIS — Z1211 Encounter for screening for malignant neoplasm of colon: Secondary | ICD-10-CM | POA: Diagnosis not present

## 2019-06-15 DIAGNOSIS — B351 Tinea unguium: Secondary | ICD-10-CM | POA: Diagnosis not present

## 2019-06-15 DIAGNOSIS — E119 Type 2 diabetes mellitus without complications: Secondary | ICD-10-CM

## 2019-06-15 LAB — BAYER DCA HB A1C WAIVED: HB A1C (BAYER DCA - WAIVED): 8 % — ABNORMAL HIGH (ref <7.0)

## 2019-06-15 MED ORDER — METFORMIN HCL ER 750 MG PO TB24: 750.0000 mg | ORAL_TABLET | Freq: Two times a day (BID) | ORAL | 1 refills | Status: DC

## 2019-06-15 MED ORDER — LISINOPRIL 10 MG PO TABS: 10.0000 mg | ORAL_TABLET | Freq: Every day | ORAL | 1 refills | Status: DC

## 2019-06-15 MED ORDER — ONETOUCH VERIO VI STRP: ORAL_STRIP | 11 refills | Status: DC

## 2019-06-15 NOTE — Patient Instructions (Signed)
Carbohydrate Counting for Diabetes Mellitus, Adult  Carbohydrate counting is a method of keeping track of how many carbohydrates you eat. Eating carbohydrates naturally increases the amount of sugar (glucose) in the blood. Counting how many carbohydrates you eat helps keep your blood glucose within normal limits, which helps you manage your diabetes (diabetes mellitus). It is important to know how many carbohydrates you can safely have in each meal. This is different for every person. A diet and nutrition specialist (registered dietitian) can help you make a meal plan and calculate how many carbohydrates you should have at each meal and snack. Carbohydrates are found in the following foods:  Grains, such as breads and cereals.  Dried beans and soy products.  Starchy vegetables, such as potatoes, peas, and corn.  Fruit and fruit juices.  Milk and yogurt.  Sweets and snack foods, such as cake, cookies, candy, chips, and soft drinks. How do I count carbohydrates? There are two ways to count carbohydrates in food. You can use either of the methods or a combination of both. Reading "Nutrition Facts" on packaged food The "Nutrition Facts" list is included on the labels of almost all packaged foods and beverages in the [place] It includes:  The serving size.  Information about nutrients in each serving, including the grams (g) of carbohydrate per serving. To use the "Nutrition Facts":  Decide how many servings you will have.  Multiply the number of servings by the number of carbohydrates per serving.  The resulting number is the total amount of carbohydrates that you will be having. Learning standard serving sizes of other foods When you eat carbohydrate foods that are not packaged or do not include "Nutrition Facts" on the label, you need to measure the servings in order to count the amount of carbohydrates:  Measure the foods that you will eat with a food scale or measuring cup, if  needed.  Decide how many standard-size servings you will eat.  Multiply the number of servings by 15. Most carbohydrate-rich foods have about 15 g of carbohydrates per serving. ? For example, if you eat 8 oz (170 g) of strawberries, you will have eaten 2 servings and 30 g of carbohydrates (2 servings x 15 g = 30 g).  For foods that have more than one food mixed, such as soups and casseroles, you must count the carbohydrates in each food that is included. The following list contains standard serving sizes of common carbohydrate-rich foods. Each of these servings has about 15 g of carbohydrates:   hamburger bun or  English muffin.   oz (15 mL) syrup.   oz (14 g) jelly.  1 slice of bread.  1 six-inch tortilla.  3 oz (85 g) cooked rice or pasta.  4 oz (113 g) cooked dried beans.  4 oz (113 g) starchy vegetable, such as peas, corn, or potatoes.  4 oz (113 g) hot cereal.  4 oz (113 g) mashed potatoes or  of a large baked potato.  4 oz (113 g) canned or frozen fruit.  4 oz (120 mL) fruit juice.  4-6 crackers.  6 chicken nuggets.  6 oz (170 g) unsweetened dry cereal.  6 oz (170 g) plain fat-free yogurt or yogurt sweetened with artificial sweeteners.  8 oz (240 mL) milk.  8 oz (170 g) fresh fruit or one small piece of fruit.  24 oz (680 g) popped popcorn. Example of carbohydrate counting Sample meal  3 oz (85 g) chicken breast.  6 oz (170 g)   brown rice.  4 oz (113 g) corn.  8 oz (240 mL) milk.  8 oz (170 g) strawberries with sugar-free whipped topping. Carbohydrate calculation 1. Identify the foods that contain carbohydrates: ? Rice. ? Corn. ? Milk. ? Strawberries. 2. Calculate how many servings you have of each food: ? 2 servings rice. ? 1 serving corn. ? 1 serving milk. ? 1 serving strawberries. 3. Multiply each number of servings by 15 g: ? 2 servings rice x 15 g = 30 g. ? 1 serving corn x 15 g = 15 g. ? 1 serving milk x 15 g = 15 g. ? 1  serving strawberries x 15 g = 15 g. 4. Add together all of the amounts to find the total grams of carbohydrates eaten: ? 30 g + 15 g + 15 g + 15 g = 75 g of carbohydrates total. Summary  Carbohydrate counting is a method of keeping track of how many carbohydrates you eat.  Eating carbohydrates naturally increases the amount of sugar (glucose) in the blood.  Counting how many carbohydrates you eat helps keep your blood glucose within normal limits, which helps you manage your diabetes.  A diet and nutrition specialist (registered dietitian) can help you make a meal plan and calculate how many carbohydrates you should have at each meal and snack. This information is not intended to replace advice given to you by your health care provider. Make sure you discuss any questions you have with your health care provider. Document Revised: 12/10/2016 Document Reviewed: 10/30/2015 Elsevier Patient Education  2020 [employer]  

## 2019-06-15 NOTE — Progress Notes (Signed)
Subjective:  Patient ID: Cory Morales, male    DOB: [birth year]  Age: 59 y.o. MRN: [id number]  CC: Follow-up   HPI [name] presents forFollow-up of diabetes. Patient checks blood sugar at home.   140 fasting and 150 postprandialSometimes up to 160-170 Patient denies symptoms such as polyuria, polydipsia, excessive hunger, nausea No significant hypoglycemic spells noted. Medications reviewed. Pt reports taking them regularly until running out 2 weeks ago.without complication/adverse reaction being reported today.  Checking feet daily.  History [name] has a past medical history of Diabetes mellitus without complication ([place]).   He has a past surgical history that includes Inguinal hernia repair (Right, 11/08/2017).   His family history includes COPD in his mother; Diabetes in his mother; Heart disease in his mother.He reports that he has never smoked. He has never used smokeless tobacco. He reports that he does not drink alcohol or use drugs.  Current Outpatient Medications on File Prior to Visit  Medication Sig Dispense Refill  . acetaminophen (TYLENOL) 325 MG tablet Take 650 mg by mouth every 6 (six) hours as needed.    . Blood Glucose Monitoring Suppl (BLOOD GLUCOSE MONITOR SYSTEM) w/Device KIT 1 Device by Does not apply route 2 (two) times daily. 1 each 0  . ibuprofen (ADVIL,MOTRIN) 200 MG tablet Take 200 mg by mouth every 6 (six) hours as needed.    . Lancets Misc. MISC Use to check blood sugars 100 each 3   No current facility-administered medications on file prior to visit.    ROS Review of Systems  Constitutional: Negative.   HENT: Negative.   Eyes: Negative for visual disturbance.  Respiratory: Negative for cough and shortness of breath.   Cardiovascular: Negative for chest pain and leg swelling.  Gastrointestinal: Negative for abdominal pain, diarrhea, nausea and vomiting.  Genitourinary: Negative for difficulty urinating.  Musculoskeletal: Negative for  arthralgias and myalgias.  Skin: Negative for rash.  Neurological: Negative for headaches.  Psychiatric/Behavioral: Negative for sleep disturbance.    Objective:  BP 134/86   Pulse 75   Temp 99.1 F (37.3 C) (Temporal)   Ht 5' 7"  (1.702 m)   Wt 170 lb 9.6 oz (77.4 kg)   BMI 26.72 kg/m   BP Readings from Last 3 Encounters:  06/15/19 134/86  07/08/18 117/73  04/05/18 134/87    Wt Readings from Last 3 Encounters:  06/15/19 170 lb 9.6 oz (77.4 kg)  07/08/18 170 lb 8 oz (77.3 kg)  04/05/18 166 lb (75.3 kg)     Physical Exam Constitutional:      General: He is not in acute distress.    Appearance: He is well-developed.  HENT:     Head: Normocephalic and atraumatic.     Right Ear: External ear normal.     Left Ear: External ear normal.     Nose: Nose normal.  Eyes:     Conjunctiva/sclera: Conjunctivae normal.     Pupils: Pupils are equal, round, and reactive to light.  Cardiovascular:     Rate and Rhythm: Normal rate and regular rhythm.     Heart sounds: Normal heart sounds. No murmur.  Pulmonary:     Effort: Pulmonary effort is normal. No respiratory distress.     Breath sounds: Normal breath sounds. No wheezing or rales.  Abdominal:     Palpations: Abdomen is soft.     Tenderness: There is no abdominal tenderness.  Musculoskeletal:        General: Normal range of motion.  Cervical back: Normal range of motion and neck supple.  Skin:    General: Skin is warm and dry.  Neurological:     Mental Status: He is alert and oriented to person, place, and time.     Deep Tendon Reflexes: Reflexes are normal and symmetric.  Psychiatric:        Behavior: Behavior normal.        Thought Content: Thought content normal.        Judgment: Judgment normal.       Assessment & Plan:   Kenn was seen today for follow-up.  Diagnoses and all orders for this visit:  Type 2 diabetes mellitus without complication, without long-term current use of insulin (HCC) -      hgba1c -     CMP14+EGFR -     CBC with Differential/Platelet  Onychomycosis  Newly diagnosed diabetes ([place]) -     metFORMIN (GLUCOPHAGE-XR) 750 MG 24 hr tablet; Take 1 tablet (750 mg total) by mouth 2 (two) times daily. -     lisinopril (ZESTRIL) 10 MG tablet; Take 1 tablet (10 mg total) by mouth daily.  Screen for colon cancer -     Colonoscopy  Other orders -     glucose blood (ONETOUCH VERIO) test strip; Use as instructed      I have discontinued Sonia Side L. Koy's terbinafine. I have also changed his OneTouch Verio and lisinopril. Additionally, I am having him maintain his Blood Glucose Monitor System, acetaminophen, ibuprofen, Lancets Misc., and metFORMIN.  Meds ordered this encounter  Medications  . glucose blood (ONETOUCH VERIO) test strip    Sig: Use as instructed    Dispense:  100 strip    Refill:  11  . metFORMIN (GLUCOPHAGE-XR) 750 MG 24 hr tablet    Sig: Take 1 tablet (750 mg total) by mouth 2 (two) times daily.    Dispense:  180 tablet    Refill:  1    $  . lisinopril (ZESTRIL) 10 MG tablet    Sig: Take 1 tablet (10 mg total) by mouth daily.    Dispense:  90 tablet    Refill:  1     Follow-up: Return in about 3 months (around 09/13/2019).  Claretta Fraise, M.D.

## 2019-06-16 ENCOUNTER — Encounter (INDEPENDENT_AMBULATORY_CARE_PROVIDER_SITE_OTHER): Payer: Self-pay | Admitting: *Deleted

## 2019-06-16 LAB — CBC WITH DIFFERENTIAL/PLATELET
Basophils Absolute: 0.1 10*3/uL (ref 0.0–0.2)
Basos: 1 %
EOS (ABSOLUTE): 0.1 10*3/uL (ref 0.0–0.4)
Eos: 2 %
Hematocrit: 40.1 % (ref 37.5–51.0)
Hemoglobin: 13.3 g/dL (ref 13.0–17.7)
Immature Grans (Abs): 0 10*3/uL (ref 0.0–0.1)
Immature Granulocytes: 0 %
Lymphocytes Absolute: 1.3 10*3/uL (ref 0.7–3.1)
Lymphs: 20 %
MCH: 28.9 pg (ref 26.6–33.0)
MCHC: 33.2 g/dL (ref 31.5–35.7)
MCV: 87 fL (ref 79–97)
Monocytes Absolute: 0.6 10*3/uL (ref 0.1–0.9)
Monocytes: 9 %
Neutrophils Absolute: 4.3 10*3/uL (ref 1.4–7.0)
Neutrophils: 68 %
Platelets: 336 10*3/uL (ref 150–450)
RBC: 4.6 x10E6/uL (ref 4.14–5.80)
RDW: 12.6 % (ref 11.6–15.4)
WBC: 6.3 10*3/uL (ref 3.4–10.8)

## 2019-06-16 LAB — CMP14+EGFR
ALT: 21 IU/L (ref 0–44)
AST: 21 IU/L (ref 0–40)
Albumin/Globulin Ratio: 1.8 (ref 1.2–2.2)
Albumin: 4.5 g/dL (ref 3.8–4.9)
Alkaline Phosphatase: 64 IU/L (ref 39–117)
BUN/Creatinine Ratio: 17 (ref 9–20)
BUN: 15 mg/dL (ref 6–24)
Bilirubin Total: 0.5 mg/dL (ref 0.0–1.2)
CO2: 23 mmol/L (ref 20–29)
Calcium: 9.5 mg/dL (ref 8.7–10.2)
Chloride: 101 mmol/L (ref 96–106)
Creatinine, Ser: 0.87 mg/dL (ref 0.76–1.27)
GFR calc Af Amer: 109 mL/min/{1.73_m2} (ref >59)
GFR calc non Af Amer: 94 mL/min/{1.73_m2} (ref >59)
Globulin, Total: 2.5 g/dL (ref 1.5–4.5)
Glucose: 149 mg/dL — ABNORMAL HIGH (ref 65–99)
Potassium: 4.5 mmol/L (ref 3.5–5.2)
Sodium: 138 mmol/L (ref 134–144)
Total Protein: 7 g/dL (ref 6.0–8.5)

## 2019-06-22 ENCOUNTER — Encounter (INDEPENDENT_AMBULATORY_CARE_PROVIDER_SITE_OTHER): Payer: Self-pay | Admitting: *Deleted

## 2020-01-11 ENCOUNTER — Other Ambulatory Visit: Payer: Self-pay | Admitting: Family Medicine

## 2020-01-11 DIAGNOSIS — E119 Type 2 diabetes mellitus without complications: Secondary | ICD-10-CM

## 2020-04-11 ENCOUNTER — Telehealth: Payer: Self-pay

## 2020-04-11 DIAGNOSIS — E119 Type 2 diabetes mellitus without complications: Secondary | ICD-10-CM

## 2020-04-11 MED ORDER — LISINOPRIL 10 MG PO TABS: 10.0000 mg | ORAL_TABLET | Freq: Every day | ORAL | 0 refills | Status: DC

## 2020-04-11 MED ORDER — METFORMIN HCL ER 750 MG PO TB24: 750.0000 mg | ORAL_TABLET | Freq: Two times a day (BID) | ORAL | 0 refills | Status: DC

## 2020-04-11 NOTE — Telephone Encounter (Signed)
30 day supply sent to pharmacy.  

## 2020-04-11 NOTE — Telephone Encounter (Signed)
  Prescription Request  04/11/2020  What is the name of the medication or equipment? lisinopril (ZESTRIL) 10 MG tablet metFORMIN (GLUCOPHAGE-XR) 750 MG 24 hr    Have you contacted your pharmacy to request a refill? (if applicable) YES Which pharmacy would you like this sent to? [facility] pt has appt with Dr. Livia Snellen on 05/14/20 he is about to run out    Patient notified that their request is being sent to the clinical staff for review and that they should receive a response within 2 business days.

## 2020-05-14 ENCOUNTER — Encounter: Payer: Self-pay | Admitting: Family Medicine

## 2020-05-14 ENCOUNTER — Ambulatory Visit (INDEPENDENT_AMBULATORY_CARE_PROVIDER_SITE_OTHER): Payer: Commercial Managed Care - PPO | Admitting: Family Medicine

## 2020-05-14 ENCOUNTER — Other Ambulatory Visit: Payer: Self-pay

## 2020-05-14 VITALS — BP 138/86 | HR 62 | Temp 97.5°F | Ht 67.0 in | Wt 171.0 lb

## 2020-05-14 DIAGNOSIS — E119 Type 2 diabetes mellitus without complications: Secondary | ICD-10-CM

## 2020-05-14 DIAGNOSIS — I10 Essential (primary) hypertension: Secondary | ICD-10-CM | POA: Diagnosis not present

## 2020-05-14 DIAGNOSIS — Z1159 Encounter for screening for other viral diseases: Secondary | ICD-10-CM

## 2020-05-14 DIAGNOSIS — Z114 Encounter for screening for human immunodeficiency virus [HIV]: Secondary | ICD-10-CM

## 2020-05-14 DIAGNOSIS — Z125 Encounter for screening for malignant neoplasm of prostate: Secondary | ICD-10-CM | POA: Diagnosis not present

## 2020-05-14 LAB — BAYER DCA HB A1C WAIVED: HB A1C (BAYER DCA - WAIVED): 8.3 % — ABNORMAL HIGH (ref <7.0)

## 2020-05-14 MED ORDER — LISINOPRIL 10 MG PO TABS: 10.0000 mg | ORAL_TABLET | Freq: Every day | ORAL | 1 refills | Status: DC

## 2020-05-14 MED ORDER — METFORMIN HCL ER 750 MG PO TB24
750.0000 mg | ORAL_TABLET | Freq: Two times a day (BID) | ORAL | 1 refills | Status: DC
Start: 2020-05-14 — End: 2020-08-08

## 2020-05-14 NOTE — Progress Notes (Signed)
Subjective:  Patient ID: Cory Morales, male    DOB: 07/22/1960  Age: 59 y.o. MRN: [id number]  CC: Follow-up   HPI Cory Morales presents forFollow-up of diabetes. Patient checks blood sugar at home.   150-170s fasting and not checking  Postprandial. Eating some sweets.  Patient denies symptoms such as polyuria, polydipsia, excessive hunger, nausea No significant hypoglycemic spells noted. Medications reviewed. Pt reports taking them regularly without complication/adverse reaction being reported today  Reports feet get cold a lot. He denies numbness.  History Cory Morales has a past medical history of Diabetes mellitus without complication ([place]).   He has a past surgical history that includes Inguinal hernia repair (Right, 11/08/2017).   His family history includes COPD in his mother; Diabetes in his mother; Heart disease in his mother.He reports that he has never smoked. He has never used smokeless tobacco. He reports that he does not drink alcohol and does not use drugs.  Current Outpatient Medications on File Prior to Visit  Medication Sig Dispense Refill  . acetaminophen (TYLENOL) 325 MG tablet Take 650 mg by mouth every 6 (six) hours as needed.    . Blood Glucose Monitoring Suppl (BLOOD GLUCOSE MONITOR SYSTEM) w/Device KIT 1 Device by Does not apply route 2 (two) times daily. 1 each 0  . glucose blood (ONETOUCH VERIO) test strip Use as instructed 100 strip 11  . ibuprofen (ADVIL,MOTRIN) 200 MG tablet Take 200 mg by mouth every 6 (six) hours as needed.    . Lancets Misc. MISC Use to check blood sugars 100 each 3   No current facility-administered medications on file prior to visit.    ROS Review of Systems  Constitutional: Negative for fever.  Respiratory: Negative for shortness of breath.   Cardiovascular: Negative for chest pain.  Musculoskeletal: Negative for arthralgias.  Skin: Negative for rash.    Objective:  BP 138/86   Pulse 62   Temp (!) 97.5 F (36.4 C)  (Temporal)   Ht _0  (1.702 m)   Wt 171 lb (77.6 kg)   BMI 26.78 kg/m   BP Readings from Last 3 Encounters:  05/14/20 138/86  06/15/19 134/86  07/08/18 117/73    Wt Readings from Last 3 Encounters:  05/14/20 171 lb (77.6 kg)  06/15/19 170 lb 9.6 oz (77.4 kg)  07/08/18 170 lb 8 oz (77.3 kg)     Physical Exam Vitals reviewed.  Constitutional:      Appearance: He is well-developed and well-nourished.  HENT:     Head: Normocephalic and atraumatic.     Right Ear: Tympanic membrane and external ear normal. No decreased hearing noted.     Left Ear: Tympanic membrane and external ear normal. No decreased hearing noted.     Mouth/Throat:     Pharynx: No oropharyngeal exudate or posterior oropharyngeal erythema.  Eyes:     Pupils: Pupils are equal, round, and reactive to light.  Cardiovascular:     Rate and Rhythm: Normal rate and regular rhythm.     Pulses:          Dorsalis pedis pulses are 0 on the right side and 0 on the left side.     Heart sounds: No murmur heard.   Pulmonary:     Effort: No respiratory distress.     Breath sounds: Normal breath sounds.  Abdominal:     General: Bowel sounds are normal.     Palpations: Abdomen is soft. There is no mass.     Tenderness:  There is no abdominal tenderness.  Musculoskeletal:     Cervical back: Normal range of motion and neck supple.       Assessment & Plan:   Cory Morales was seen today for follow-up.  Diagnoses and all orders for this visit:  Type 2 diabetes mellitus without complication, without long-term current use of insulin (HCC) -     CBC with Differential/Platelet -     CMP14+EGFR -     Lipid panel -     PSA, total and free -     Cancel: Microalbumin / creatinine urine ratio -     Bayer DCA Hb A1c Waived -     US ARTERIAL ABI (SCREENING LOWER EXTREMITY); Future  Prostate cancer screening -     PSA, total and free  Hypertension, unspecified type -     CBC with Differential/Platelet -     CMP14+EGFR -      Lipid panel -     PSA, total and free -     Cancel: Microalbumin / creatinine urine ratio -     Bayer DCA Hb A1c Waived  Need for hepatitis C screening test -     Hepatitis C antibody  Encounter for screening for HIV -     HIV Antibody (routine testing w rflx) -     Cancel: HIV Antibody (routine testing w rflx)  Newly diagnosed diabetes (HCC) -     lisinopril (ZESTRIL) 10 MG tablet; Take 1 tablet (10 mg total) by mouth daily. Needs to be seen before next refill -     metFORMIN (GLUCOPHAGE-XR) 750 MG 24 hr tablet; Take 1 tablet (750 mg total) by mouth in the morning and at bedtime. Needs to be seen before next refill      I am having Cory Morales maintain his Blood Glucose Monitor System, acetaminophen, ibuprofen, Lancets Misc., OneTouch Verio, lisinopril, and metFORMIN.  Meds ordered this encounter  Medications  . lisinopril (ZESTRIL) 10 MG tablet    Sig: Take 1 tablet (10 mg total) by mouth daily. Needs to be seen before next refill    Dispense:  90 tablet    Refill:  1  . metFORMIN (GLUCOPHAGE-XR) 750 MG 24 hr tablet    Sig: Take 1 tablet (750 mg total) by mouth in the morning and at bedtime. Needs to be seen before next refill    Dispense:  180 tablet    Refill:  1     Follow-up: Return in about 3 months (around 08/12/2020).  Cory Morales, M.D.

## 2020-05-15 LAB — LIPID PANEL
Chol/HDL Ratio: 4.2 ratio (ref 0.0–5.0)
Cholesterol, Total: 189 mg/dL (ref 100–199)
HDL: 45 mg/dL (ref >39)
LDL Chol Calc (NIH): 122 mg/dL — ABNORMAL HIGH (ref 0–99)
Triglycerides: 122 mg/dL (ref 0–149)
VLDL Cholesterol Cal: 22 mg/dL (ref 5–40)

## 2020-05-15 LAB — CMP14+EGFR
ALT: 14 IU/L (ref 0–44)
AST: 14 IU/L (ref 0–40)
Albumin/Globulin Ratio: 1.8 (ref 1.2–2.2)
Albumin: 4.4 g/dL (ref 3.8–4.9)
Alkaline Phosphatase: 63 IU/L (ref 44–121)
BUN/Creatinine Ratio: 12 (ref 10–24)
BUN: 11 mg/dL (ref 8–27)
Bilirubin Total: 0.3 mg/dL (ref 0.0–1.2)
CO2: 22 mmol/L (ref 20–29)
Calcium: 9.4 mg/dL (ref 8.6–10.2)
Chloride: 99 mmol/L (ref 96–106)
Creatinine, Ser: 0.91 mg/dL (ref 0.76–1.27)
GFR calc Af Amer: 106 mL/min/{1.73_m2} (ref >59)
GFR calc non Af Amer: 91 mL/min/{1.73_m2} (ref >59)
Globulin, Total: 2.5 g/dL (ref 1.5–4.5)
Glucose: 156 mg/dL — ABNORMAL HIGH (ref 65–99)
Potassium: 4.7 mmol/L (ref 3.5–5.2)
Sodium: 134 mmol/L (ref 134–144)
Total Protein: 6.9 g/dL (ref 6.0–8.5)

## 2020-05-15 LAB — CBC WITH DIFFERENTIAL/PLATELET
Basophils Absolute: 0.1 10*3/uL (ref 0.0–0.2)
Basos: 1 %
EOS (ABSOLUTE): 0.2 10*3/uL (ref 0.0–0.4)
Eos: 3 %
Hematocrit: 39.1 % (ref 37.5–51.0)
Hemoglobin: 13.1 g/dL (ref 13.0–17.7)
Immature Grans (Abs): 0 10*3/uL (ref 0.0–0.1)
Immature Granulocytes: 0 %
Lymphocytes Absolute: 1.3 10*3/uL (ref 0.7–3.1)
Lymphs: 19 %
MCH: 28.8 pg (ref 26.6–33.0)
MCHC: 33.5 g/dL (ref 31.5–35.7)
MCV: 86 fL (ref 79–97)
Monocytes Absolute: 0.6 10*3/uL (ref 0.1–0.9)
Monocytes: 8 %
Neutrophils Absolute: 4.9 10*3/uL (ref 1.4–7.0)
Neutrophils: 69 %
Platelets: 337 10*3/uL (ref 150–450)
RBC: 4.55 x10E6/uL (ref 4.14–5.80)
RDW: 12 % (ref 11.6–15.4)
WBC: 7.1 10*3/uL (ref 3.4–10.8)

## 2020-05-15 LAB — PSA, TOTAL AND FREE
PSA, Free Pct: 32.5 %
PSA, Free: 0.13 ng/mL
Prostate Specific Ag, Serum: 0.4 ng/mL (ref 0.0–4.0)

## 2020-05-15 LAB — HIV ANTIBODY (ROUTINE TESTING W REFLEX): HIV Screen 4th Generation wRfx: NONREACTIVE

## 2020-05-15 LAB — HEPATITIS C ANTIBODY: Hep C Virus Ab: <0.1 s/co ratio (ref 0.0–0.9)

## 2020-05-17 NOTE — Progress Notes (Signed)
Hello Qadir,  Your lab result is normal and/or stable.Some minor variations that are not significant are commonly marked abnormal, but do not represent any medical problem for you.  Best regards, Rohail Klees, M.D.

## 2020-05-20 ENCOUNTER — Ambulatory Visit (HOSPITAL_COMMUNITY)
Admission: RE | Admit: 2020-05-20 | Discharge: 2020-05-20 | Disposition: A | Discharge status: Home / Self Care | Payer: Commercial Managed Care - PPO | Source: Ambulatory Visit | Attending: Family Medicine | Admitting: Family Medicine

## 2020-05-20 ENCOUNTER — Other Ambulatory Visit: Payer: Self-pay

## 2020-05-20 DIAGNOSIS — E119 Type 2 diabetes mellitus without complications: Secondary | ICD-10-CM | POA: Diagnosis not present

## 2020-06-14 ENCOUNTER — Other Ambulatory Visit: Payer: Self-pay | Admitting: Family Medicine

## 2020-08-08 ENCOUNTER — Other Ambulatory Visit: Payer: Self-pay

## 2020-08-08 ENCOUNTER — Encounter: Payer: Self-pay | Admitting: Family Medicine

## 2020-08-08 ENCOUNTER — Ambulatory Visit (INDEPENDENT_AMBULATORY_CARE_PROVIDER_SITE_OTHER): Payer: Commercial Managed Care - PPO | Admitting: Family Medicine

## 2020-08-08 VITALS — BP 115/70 | HR 76 | Temp 98.3°F | Ht 67.0 in | Wt 168.8 lb

## 2020-08-08 DIAGNOSIS — E119 Type 2 diabetes mellitus without complications: Secondary | ICD-10-CM | POA: Diagnosis not present

## 2020-08-08 DIAGNOSIS — I10 Essential (primary) hypertension: Secondary | ICD-10-CM

## 2020-08-08 DIAGNOSIS — B351 Tinea unguium: Secondary | ICD-10-CM | POA: Diagnosis not present

## 2020-08-08 LAB — CMP14+EGFR
ALT: 16 IU/L (ref 0–44)
AST: 17 IU/L (ref 0–40)
Albumin/Globulin Ratio: 1.7 (ref 1.2–2.2)
Albumin: 4.5 g/dL (ref 3.8–4.9)
Alkaline Phosphatase: 64 IU/L (ref 44–121)
BUN/Creatinine Ratio: 13 (ref 10–24)
BUN: 12 mg/dL (ref 8–27)
Bilirubin Total: 0.3 mg/dL (ref 0.0–1.2)
CO2: 21 mmol/L (ref 20–29)
Calcium: 9.5 mg/dL (ref 8.6–10.2)
Chloride: 102 mmol/L (ref 96–106)
Creatinine, Ser: 0.93 mg/dL (ref 0.76–1.27)
Globulin, Total: 2.6 g/dL (ref 1.5–4.5)
Glucose: 151 mg/dL — ABNORMAL HIGH (ref 65–99)
Potassium: 4.8 mmol/L (ref 3.5–5.2)
Sodium: 139 mmol/L (ref 134–144)
Total Protein: 7.1 g/dL (ref 6.0–8.5)
eGFR: 94 mL/min/{1.73_m2} (ref >59)

## 2020-08-08 LAB — CBC WITH DIFFERENTIAL/PLATELET
Basophils Absolute: 0.1 10*3/uL (ref 0.0–0.2)
Basos: 1 %
EOS (ABSOLUTE): 0.2 10*3/uL (ref 0.0–0.4)
Eos: 3 %
Hematocrit: 40.2 % (ref 37.5–51.0)
Hemoglobin: 13.3 g/dL (ref 13.0–17.7)
Immature Grans (Abs): 0 10*3/uL (ref 0.0–0.1)
Immature Granulocytes: 0 %
Lymphocytes Absolute: 1.3 10*3/uL (ref 0.7–3.1)
Lymphs: 18 %
MCH: 28.6 pg (ref 26.6–33.0)
MCHC: 33.1 g/dL (ref 31.5–35.7)
MCV: 87 fL (ref 79–97)
Monocytes Absolute: 0.6 10*3/uL (ref 0.1–0.9)
Monocytes: 8 %
Neutrophils Absolute: 4.9 10*3/uL (ref 1.4–7.0)
Neutrophils: 70 %
Platelets: 338 10*3/uL (ref 150–450)
RBC: 4.65 x10E6/uL (ref 4.14–5.80)
RDW: 12.2 % (ref 11.6–15.4)
WBC: 6.9 10*3/uL (ref 3.4–10.8)

## 2020-08-08 LAB — BAYER DCA HB A1C WAIVED: HB A1C (BAYER DCA - WAIVED): 8.2 % — ABNORMAL HIGH (ref <7.0)

## 2020-08-08 MED ORDER — DAPAGLIFLOZIN PROPANEDIOL 10 MG PO TABS: 10.0000 mg | ORAL_TABLET | Freq: Every day | ORAL | 3 refills | Status: DC

## 2020-08-08 MED ORDER — TERBINAFINE HCL 250 MG PO TABS: 250.0000 mg | ORAL_TABLET | Freq: Every day | ORAL | 2 refills | Status: DC

## 2020-08-08 MED ORDER — LISINOPRIL 10 MG PO TABS: 10.0000 mg | ORAL_TABLET | Freq: Every day | ORAL | 1 refills | Status: DC

## 2020-08-08 MED ORDER — CONTOUR NEXT TEST VI STRP: ORAL_STRIP | 3 refills | Status: DC

## 2020-08-08 MED ORDER — METFORMIN HCL ER 750 MG PO TB24: 750.0000 mg | ORAL_TABLET | Freq: Two times a day (BID) | ORAL | 1 refills | Status: DC

## 2020-08-08 NOTE — Progress Notes (Signed)
Subjective:  Patient ID: Cory Morales,  male    DOB: 1961-05-28  Age: 60 y.o.    CC: Medical Management of Chronic Issues   HPI Cory Morales presents for  follow-up of hypertension. Patient has no history of headache chest pain or shortness of breath or recent cough. Patient also denies symptoms of TIA such as numbness weakness lateralizing. Patient denies side effects from medication. States taking it regularly.  Patient also  in for follow-up of elevated cholesterol. Doing well without complaints on current medication. Denies side effects  including myalgia and arthralgia and nausea. Also in today for liver function testing. Currently no chest pain, shortness of breath or other cardiovascular related symptoms noted.  Follow-up of diabetes. Patient does check blood sugar at home. Readings run between 145 and 165 fasting. Higher post prandial.  Patient denies symptoms such as excessive hunger or urinary frequency, excessive hunger, nausea No significant hypoglycemic spells noted. Medications reviewed. Pt reports taking them regularly. Pt. denies complication/adverse reaction today.    History Cory Morales has a past medical history of Diabetes mellitus without complication ([place]).   He has a past surgical history that includes Inguinal hernia repair (Right, 11/08/2017).   His family history includes COPD in his mother; Diabetes in his mother; Heart disease in his mother.He reports that he has never smoked. He has never used smokeless tobacco. He reports that he does not drink alcohol and does not use drugs.  Current Outpatient Medications on File Prior to Visit  Medication Sig Dispense Refill  . acetaminophen (TYLENOL) 325 MG tablet Take 650 mg by mouth every 6 (six) hours as needed.    . Blood Glucose Monitoring Suppl (BLOOD GLUCOSE MONITOR SYSTEM) w/Device KIT 1 Device by Does not apply route 2 (two) times daily. 1 each 0  . ibuprofen (ADVIL,MOTRIN) 200 MG tablet Take 200 mg by  mouth every 6 (six) hours as needed.    . Lancets Misc. MISC Use to check blood sugars 100 each 3   No current facility-administered medications on file prior to visit.    ROS Review of Systems  Constitutional: Negative.  Negative for fever.  HENT: Negative.   Eyes: Negative for visual disturbance.  Respiratory: Negative for cough and shortness of breath.   Cardiovascular: Negative for chest pain and leg swelling.  Gastrointestinal: Negative for abdominal pain, diarrhea, nausea and vomiting.  Genitourinary: Negative for difficulty urinating.  Musculoskeletal: Negative for arthralgias and myalgias.  Skin: Negative for rash.  Neurological: Negative for headaches.  Psychiatric/Behavioral: Negative for sleep disturbance.    Objective:  BP 115/70   Pulse 76   Temp 98.3 F (36.8 C)   Ht _0  (1.702 m)   Wt 168 lb 12.8 oz (76.6 kg)   SpO2 98%   BMI 26.44 kg/m   BP Readings from Last 3 Encounters:  08/08/20 115/70  05/14/20 138/86  06/15/19 134/86    Wt Readings from Last 3 Encounters:  08/08/20 168 lb 12.8 oz (76.6 kg)  05/14/20 171 lb (77.6 kg)  06/15/19 170 lb 9.6 oz (77.4 kg)     Physical Exam Vitals reviewed.  Constitutional:      Appearance: He is well-developed.  HENT:     Head: Normocephalic and atraumatic.     Right Ear: External ear normal.     Left Ear: External ear normal.     Mouth/Throat:     Pharynx: No oropharyngeal exudate or posterior oropharyngeal erythema.  Eyes:     Pupils: Pupils  are equal, round, and reactive to light.  Cardiovascular:     Rate and Rhythm: Normal rate and regular rhythm.     Heart sounds: No murmur heard.   Pulmonary:     Effort: No respiratory distress.     Breath sounds: Normal breath sounds.  Musculoskeletal:     Cervical back: Normal range of motion and neck supple.  Neurological:     Mental Status: He is alert and oriented to person, place, and time.     Diabetic Foot Exam - Simple   No data filed        Assessment & Plan:   Kadan was seen today for medical management of chronic issues.  Diagnoses and all orders for this visit:  Type 2 diabetes mellitus without complication, without long-term current use of insulin (HCC) -     Bayer DCA Hb A1c Waived -     CMP14+EGFR -     CBC with Differential/Platelet  Hypertension, unspecified type -     CMP14+EGFR  Newly diagnosed diabetes ([place]) -     lisinopril (ZESTRIL) 10 MG tablet; Take 1 tablet (10 mg total) by mouth daily. -     metFORMIN (GLUCOPHAGE-XR) 750 MG 24 hr tablet; Take 1 tablet (750 mg total) by mouth in the morning and at bedtime.  Onychomycosis  Other orders -     glucose blood (CONTOUR NEXT TEST) test strip; Test BS BID Dx E11.9 -     dapagliflozin propanediol (FARXIGA) 10 MG TABS tablet; Take 1 tablet (10 mg total) by mouth daily before breakfast. -     terbinafine (LAMISIL) 250 MG tablet; Take 1 tablet (250 mg total) by mouth daily.   I have changed Sonia Side L. Cheek's lisinopril and metFORMIN. I am also having him start on dapagliflozin propanediol and terbinafine. Additionally, I am having him maintain his Blood Glucose Monitor System, acetaminophen, ibuprofen, Lancets Misc., and Contour Next Test.  Meds ordered this encounter  Medications  . lisinopril (ZESTRIL) 10 MG tablet    Sig: Take 1 tablet (10 mg total) by mouth daily.    Dispense:  90 tablet    Refill:  1  . glucose blood (CONTOUR NEXT TEST) test strip    Sig: Test BS BID Dx E11.9    Dispense:  200 strip    Refill:  3  . metFORMIN (GLUCOPHAGE-XR) 750 MG 24 hr tablet    Sig: Take 1 tablet (750 mg total) by mouth in the morning and at bedtime.    Dispense:  180 tablet    Refill:  1  . dapagliflozin propanediol (FARXIGA) 10 MG TABS tablet    Sig: Take 1 tablet (10 mg total) by mouth daily before breakfast.    Dispense:  90 tablet    Refill:  3  . terbinafine (LAMISIL) 250 MG tablet    Sig: Take 1 tablet (250 mg total) by mouth daily.     Dispense:  30 tablet    Refill:  2     Follow-up: No follow-ups on file.  Claretta Fraise, M.D.

## 2020-08-09 NOTE — Progress Notes (Signed)
Hello Shaquille,  Your lab result is normal and/or stable.Some minor variations that are not significant are commonly marked abnormal, but do not represent any medical problem for you.  Best regards, Ruchy Wildrick, M.D.

## 2020-09-11 LAB — HM DIABETES EYE EXAM

## 2020-10-01 ENCOUNTER — Telehealth (INDEPENDENT_AMBULATORY_CARE_PROVIDER_SITE_OTHER): Payer: Self-pay

## 2020-10-01 ENCOUNTER — Encounter (INDEPENDENT_AMBULATORY_CARE_PROVIDER_SITE_OTHER): Payer: Self-pay

## 2020-10-01 ENCOUNTER — Other Ambulatory Visit (INDEPENDENT_AMBULATORY_CARE_PROVIDER_SITE_OTHER): Payer: Self-pay

## 2020-10-01 DIAGNOSIS — Z1211 Encounter for screening for malignant neoplasm of colon: Secondary | ICD-10-CM

## 2020-10-01 MED ORDER — PEG 3350-KCL-NA BICARB-NACL 420 G PO SOLR: 4000.0000 mL | ORAL | 0 refills | Status: DC

## 2020-10-01 NOTE — Telephone Encounter (Signed)
Referring MD/PCP: Stacks  Procedure: Tcs  Reason/Indication:  Screening  Has patient had this procedure before?  no  If so, when, by whom and where?    Is there a family history of colon cancer?  no  Who?  What age when diagnosed?    Is patient diabetic? If yes, Type 1 or Type 2   Yes Type 2      Does patient have prosthetic heart valve or mechanical valve?  no  Do you have a pacemaker/defibrillator?  no  Has patient ever had endocarditis/atrial fibrillation? no  Have you had a stroke/heart attack last 6 mths? no  Does patient use oxygen? no  Has patient had joint replacement within last 12 months?  no  Is patient constipated or do they take laxatives? no  Does patient have a history of alcohol/drug use?  no  Is patient on blood thinner such as Coumadin, Plavix and/or Aspirin? no  Do you take medicine for weight loss?  no  For male patients,: do you still have your menstrual cycle? no  Medications: Metformin 750mg  bid, Lisinopril 10 mg daily  Allergies: nkda  Medication Adjustment per Dr Rehman/Dr Jenetta Downer Hold Metformin the evening prior and the morning of your procedure  Procedure date & time: 10/08/20 at 2:00

## 2020-10-01 NOTE — Telephone Encounter (Signed)
Ok to schedule.  Thanks,  Cinde Ebert Castaneda Mayorga, MD Gastroenterology and Hepatology [facility]  

## 2020-10-01 NOTE — Telephone Encounter (Signed)
LeighAnn Jahara Dail, CMA  

## 2020-10-01 NOTE — Telephone Encounter (Signed)
Referring MD/PCP: Stacks  Procedure: Tcs   Reason/Indication:  Screening  Has patient had this procedure before?  no  If so, when, by whom and where?    Is there a family history of colon cancer?  no  Who?  What age when diagnosed?    Is patient diabetic? If yes, Type 1 or Type 2   Yes Type 2      Does patient have prosthetic heart valve or mechanical valve?  no  Do you have a pacemaker/defibrillator?  no  Has patient ever had endocarditis/atrial fibrillation? no  Have you had a stroke/heart attack last 6 mths? no  Does patient use oxygen? no  Has patient had joint replacement within last 12 months?  no  Is patient constipated or do they take laxatives? no  Does patient have a history of alcohol/drug use?  no  Is patient on blood thinner such as Coumadin, Plavix and/or Aspirin? no  Do you take medicine for weight loss?  no  For male patients,: do you still have your menstrual cycle? no  Medications: Metformin 750 mg bid, lisinopril 10 mg daily   Allergies: nkda  Medication Adjustment per Dr Rehman/Dr Jenetta Downer Hold Metformin the evening prior and the morning of procedure  Procedure date & time: 10/08/20 at 2:00

## 2020-10-07 ENCOUNTER — Other Ambulatory Visit (HOSPITAL_COMMUNITY)
Admission: RE | Admit: 2020-10-07 | Discharge: 2020-10-07 | Disposition: A | Discharge status: Home / Self Care | Payer: Commercial Managed Care - PPO | Source: Ambulatory Visit | Attending: Gastroenterology | Admitting: Gastroenterology

## 2020-10-07 ENCOUNTER — Other Ambulatory Visit: Payer: Self-pay

## 2020-10-07 DIAGNOSIS — Z20822 Contact with and (suspected) exposure to covid-19: Secondary | ICD-10-CM | POA: Diagnosis not present

## 2020-10-07 DIAGNOSIS — Z01812 Encounter for preprocedural laboratory examination: Secondary | ICD-10-CM | POA: Insufficient documentation

## 2020-10-07 LAB — SARS CORONAVIRUS 2 (TAT 6-24 HRS): SARS Coronavirus 2: NEGATIVE

## 2020-10-08 ENCOUNTER — Ambulatory Visit (HOSPITAL_COMMUNITY)
Admission: RE | Admit: 2020-10-08 | Discharge: 2020-10-08 | Disposition: A | Discharge status: Home / Self Care | Payer: Commercial Managed Care - PPO | Source: Ambulatory Visit | Attending: Gastroenterology | Admitting: Gastroenterology

## 2020-10-08 ENCOUNTER — Other Ambulatory Visit: Payer: Self-pay

## 2020-10-08 ENCOUNTER — Encounter (HOSPITAL_COMMUNITY)
Admission: RE | Disposition: A | Discharge status: Home / Self Care | Payer: Self-pay | Source: Ambulatory Visit | Attending: Gastroenterology

## 2020-10-08 ENCOUNTER — Ambulatory Visit (HOSPITAL_COMMUNITY): Payer: Commercial Managed Care - PPO | Admitting: Anesthesiology

## 2020-10-08 ENCOUNTER — Encounter (HOSPITAL_COMMUNITY): Payer: Self-pay | Admitting: Gastroenterology

## 2020-10-08 DIAGNOSIS — D122 Benign neoplasm of ascending colon: Secondary | ICD-10-CM

## 2020-10-08 DIAGNOSIS — Z79899 Other long term (current) drug therapy: Secondary | ICD-10-CM | POA: Insufficient documentation

## 2020-10-08 DIAGNOSIS — Z7984 Long term (current) use of oral hypoglycemic drugs: Secondary | ICD-10-CM | POA: Insufficient documentation

## 2020-10-08 DIAGNOSIS — E119 Type 2 diabetes mellitus without complications: Secondary | ICD-10-CM | POA: Insufficient documentation

## 2020-10-08 DIAGNOSIS — Z1211 Encounter for screening for malignant neoplasm of colon: Secondary | ICD-10-CM | POA: Diagnosis not present

## 2020-10-08 DIAGNOSIS — K5730 Diverticulosis of large intestine without perforation or abscess without bleeding: Secondary | ICD-10-CM | POA: Diagnosis not present

## 2020-10-08 DIAGNOSIS — Z833 Family history of diabetes mellitus: Secondary | ICD-10-CM | POA: Diagnosis not present

## 2020-10-08 HISTORY — PX: COLONOSCOPY WITH PROPOFOL: SHX5780

## 2020-10-08 HISTORY — PX: POLYPECTOMY: SHX5525

## 2020-10-08 LAB — GLUCOSE, CAPILLARY: Glucose-Capillary: 115 mg/dL — ABNORMAL HIGH (ref 70–99)

## 2020-10-08 SURGERY — COLONOSCOPY WITH PROPOFOL
Anesthesia: General

## 2020-10-08 MED ORDER — LACTATED RINGERS IV SOLN: INTRAVENOUS | Status: DC

## 2020-10-08 MED ORDER — PROPOFOL 10 MG/ML IV BOLUS
INTRAVENOUS | Status: DC | PRN
  Administered 2020-10-08: 100 mg via INTRAVENOUS

## 2020-10-08 MED ORDER — PROPOFOL 500 MG/50ML IV EMUL
INTRAVENOUS | Status: DC | PRN
  Administered 2020-10-08: 150 ug/kg/min via INTRAVENOUS

## 2020-10-08 NOTE — Op Note (Signed)
[facility] Patient Name: Cory Morales Procedure Date: 10/08/2020 1:32 PM MRN: [id number] Date of Birth: 08-08-1960 Attending MD: Maylon Peppers ,  CSN: [id number] Age: 60 Admit Type: Outpatient Procedure:                Colonoscopy Indications:              Screening for colorectal malignant neoplasm Providers:                Maylon Peppers, [place] Sharon Seller, RN, Nelma Rothman, Technician Referring MD:              Medicines:                Monitored Anesthesia Care Complications:            No immediate complications. Estimated Blood Loss:     Estimated blood loss: none. Procedure:                Pre-Anesthesia Assessment:                           - Prior to the procedure, a History and Physical                            was performed, and patient medications, allergies                            and sensitivities were reviewed. The patient's                            tolerance of previous anesthesia was reviewed.                           - The risks and benefits of the procedure and the                            sedation options and risks were discussed with the                            patient. All questions were answered and informed                            consent was obtained.                           - ASA Grade Assessment: II - A patient with mild                            systemic disease.                           After obtaining informed consent, the colonoscope                            was passed under direct vision. Throughout the  procedure, the patient's blood pressure, pulse, and                            oxygen saturations were monitored continuously.The                            colonoscopy was performed without difficulty. The                            patient tolerated the procedure well. The quality                            of the bowel preparation was excellent. The                             [id number]-[id number] ([id number]) scope was introduced through                            the anus and advanced to the the terminal ileum. Scope In: 2:11:52 PM Scope Out: 2:34:50 PM Scope Withdrawal Time: 0 hours 19 minutes 4 seconds  Total Procedure Duration: 0 hours 22 minutes 58 seconds  Findings:      The perianal and digital rectal examinations were normal.      The terminal ileum appeared normal.      A 5 mm polyp was found in the ascending colon. The polyp was sessile.       The polyp was removed with a cold snare. Resection and retrieval were       complete.      Multiple small and large-mouthed diverticula were found in the sigmoid       colon, descending colon and ascending colon.      The retroflexed view of the distal rectum and anal verge was normal and       showed no anal or rectal abnormalities. Impression:               - The examined portion of the ileum was normal.                           - One 5 mm polyp in the ascending colon, removed                            with a cold snare. Resected and retrieved.                           - Diverticulosis in the sigmoid colon, in the                            descending colon and in the ascending colon.                           - The distal rectum and anal verge are normal on                            retroflexion view. Moderate Sedation:      Per Anesthesia Care  Recommendation:           - Discharge patient to home (ambulatory).                           - Resume previous diet.                           - Await pathology results.                           - Repeat colonoscopy for surveillance based on                            pathology results. Procedure Code(s):        --- Professional ---                           [phone], Colonoscopy, flexible; with removal of                            tumor(s), polyp(s), or other lesion(s) by snare                            technique Diagnosis Code(s):        --- Professional  ---                           Z12.11, Encounter for screening for malignant                            neoplasm of colon                           K63.5, Polyp of colon                           K57.30, Diverticulosis of large intestine without                            perforation or abscess without bleeding CPT copyright 2019 [employer]. All rights reserved. The codes documented in this report are preliminary and upon coder review may  be revised to meet current compliance requirements. Maylon Peppers, MD Maylon Peppers,  10/08/2020 2:38:16 PM This report has been signed electronically. Number of Addenda: 0

## 2020-10-08 NOTE — Anesthesia Postprocedure Evaluation (Signed)
Anesthesia Post Note  Patient: Cory Morales  Procedure(s) Performed: COLONOSCOPY WITH PROPOFOL (N/A ) POLYPECTOMY  Patient location during evaluation: Phase II Anesthesia Type: General Level of consciousness: awake, awake and alert, oriented and patient cooperative Pain management: satisfactory to patient Vital Signs Assessment: post-procedure vital signs reviewed and stable Respiratory status: spontaneous breathing, nonlabored ventilation and respiratory function stable Cardiovascular status: stable Postop Assessment: no apparent nausea or vomiting Anesthetic complications: no   No complications documented.   Last Vitals:  Vitals:   10/08/20 1436 10/08/20 1443  BP: (!) 84/62 (!) 100/55  Pulse: 63   Resp: 16   Temp: 36.8 C   SpO2: 97%     Last Pain:  Vitals:   10/08/20 1436  TempSrc: Oral  PainSc: 0-No pain                 Maryna Yeagle

## 2020-10-08 NOTE — Transfer of Care (Signed)
Immediate Anesthesia Transfer of Care Note  Patient: Cory Morales  Procedure(s) Performed: COLONOSCOPY WITH PROPOFOL (N/A ) POLYPECTOMY  Patient Location: PACU  Anesthesia Type:General  Level of Consciousness: awake, alert , oriented and patient cooperative  Airway & Oxygen Therapy: Patient Spontanous Breathing  Post-op Assessment: Report given to RN, Post -op Vital signs reviewed and stable and Patient moving all extremities X 4  Post vital signs: Reviewed and stable  Last Vitals:  Vitals Value Taken Time  BP    Temp    Pulse    Resp    SpO2      Last Pain:  Vitals:   10/08/20 1436  TempSrc: Oral  PainSc: 0-No pain         Complications: No complications documented.

## 2020-10-08 NOTE — H&P (Signed)
Cory Morales is an 60 y.o. male.   Chief Complaint: Screening colonoscopy HPI: 60 year old male with past medical history of diabetes, coming for screening colonoscopy. The patient has never had a colonoscopy in the past.  The patient denies having any complaints such as melena, hematochezia, abdominal pain or distention, change in her bowel movement consistency or frequency, no changes in her weight recently.  No family history of colorectal cancer.   Past Medical History:  Diagnosis Date  . Diabetes mellitus without complication [facility])     Past Surgical History:  Procedure Laterality Date  . INGUINAL HERNIA REPAIR Right 11/08/2017   Procedure: HERNIA REPAIR INGUINAL ADULT WITH MESH;  Surgeon: [name], MD;  Location: [facility];  Service: General;  Laterality: Right;    Family History  Problem Relation Age of Onset  . COPD Mother   . Diabetes Mother   . Heart disease Mother   . Colon cancer Neg Hx    Social History:  reports that he has never smoked. He has never used smokeless tobacco. He reports that he does not drink alcohol and does not use drugs.  Allergies: No Known Allergies  Medications Prior to Admission  Medication Sig Dispense Refill  . dapagliflozin propanediol (FARXIGA) 10 MG TABS tablet Take 1 tablet (10 mg total) by mouth daily before breakfast. (Patient taking differently: Take 10 mg by mouth in the morning.) 90 tablet 3  . lisinopril (ZESTRIL) 10 MG tablet Take 1 tablet (10 mg total) by mouth daily. (Patient taking differently: Take 10 mg by mouth in the morning.) 90 tablet 1  . metFORMIN (GLUCOPHAGE-XR) 750 MG 24 hr tablet Take 1 tablet (750 mg total) by mouth in the morning and at bedtime. 180 tablet 1  . polyethylene glycol-electrolytes (TRILYTE) 420 g solution Take 4,000 mLs by mouth as directed. 4000 mL 0  . terbinafine (LAMISIL) 250 MG tablet Take 1 tablet (250 mg total) by mouth daily. (Patient taking differently: Take 250 mg by mouth in the  morning.) 30 tablet 2  . acetaminophen (TYLENOL) 325 MG tablet Take 650 mg by mouth every 6 (six) hours as needed (pain).    . Blood Glucose Monitoring Suppl (BLOOD GLUCOSE MONITOR SYSTEM) w/Device KIT 1 Device by Does not apply route 2 (two) times daily. 1 each 0  . glucose blood (CONTOUR NEXT TEST) test strip Test BS BID Dx E11.9 200 strip 3  . ibuprofen (ADVIL,MOTRIN) 200 MG tablet Take 200 mg by mouth every 8 (eight) hours as needed (pain).    . Lancets Misc. MISC Use to check blood sugars 100 each 3    Results for orders placed or performed during the hospital encounter of 10/08/20 (from the past 48 hour(s))  Glucose, capillary     Status: Abnormal   Collection Time: 10/08/20 12:55 PM  Result Value Ref Range   Glucose-Capillary 115 (H) 70 - 99 mg/dL    Comment: Glucose reference range applies only to samples taken after fasting for at least 8 hours.   No results found.  Review of Systems  Constitutional: Negative.   HENT: Negative.   Eyes: Negative.   Respiratory: Negative.   Cardiovascular: Negative.   Gastrointestinal: Negative.   Endocrine: Negative.   Genitourinary: Negative.   Musculoskeletal: Negative.   Skin: Negative.   Allergic/Immunologic: Negative.   Neurological: Negative.   Hematological: Negative.   Psychiatric/Behavioral: Negative.     Blood pressure 128/83, pulse 70, temperature 98.3 F (36.8 C), temperature source Oral, resp. rate 12,  SpO2 98 %. Physical Exam  GENERAL: The patient is AO x3, in no acute distress. HEENT: Head is normocephalic and atraumatic. EOMI are intact. Mouth is well hydrated and without lesions. NECK: Supple. No masses LUNGS: Clear to auscultation. No presence of rhonchi/wheezing/rales. Adequate chest expansion HEART: RRR, normal s1 and s2. ABDOMEN: Soft, nontender, no guarding, no peritoneal signs, and nondistended. BS +. No masses. EXTREMITIES: Without any cyanosis, clubbing, rash, lesions or edema. NEUROLOGIC: AOx3, no focal  motor deficit. SKIN: no jaundice, no rashes  Assessment/Plan 60 year old male with past medical history of diabetes, coming for screening colonoscopy. The patient is at average risk for colorectal cancer.  We will proceed with colonoscopy today.   Harvel Quale, MD 10/08/2020, 1:34 PM

## 2020-10-08 NOTE — Discharge Instructions (Signed)
You are being discharged to home.  Resume your previous diet.  We are waiting for your pathology results.  Your physician has recommended a repeat colonoscopy for surveillance based on pathology results.    PATIENT INSTRUCTIONS POST-ANESTHESIA  IMMEDIATELY FOLLOWING SURGERY:  Do not drive or operate machinery for the first twenty four hours after surgery.  Do not make any important decisions for twenty four hours after surgery or while taking narcotic pain medications or sedatives.  If you develop intractable nausea and vomiting or a severe headache please notify your doctor immediately.  FOLLOW-UP:  Please make an appointment with your surgeon as instructed. You do not need to follow up with anesthesia unless specifically instructed to do so.  WOUND CARE INSTRUCTIONS (if applicable):  Keep a dry clean dressing on the anesthesia/puncture wound site if there is drainage.  Once the wound has quit draining you may leave it open to air.  Generally you should leave the bandage intact for twenty four hours unless there is drainage.  If the epidural site drains for more than 36-48 hours please call the anesthesia department.  QUESTIONS?:  Please feel free to call your physician or the hospital operator if you have any questions, and they will be happy to assist you.      Colonoscopy, Adult, Care After This sheet gives you information about how to care for yourself after your procedure. Your doctor may also give you more specific instructions. If you have problems or questions, call your doctor. What can I expect after the procedure? After the procedure, it is common to have:  A small amount of blood in your poop (stool) for 24 hours.  Some gas.  Mild cramping or bloating in your belly (abdomen). Follow these instructions at home: Eating and drinking  Drink enough fluid to keep your pee (urine) pale yellow.  Follow instructions from your doctor about what you cannot eat or drink.  Return  to your normal diet as told by your doctor. Avoid heavy or fried foods that are hard to digest.   Activity  Rest as told by your doctor.  Do not sit for a long time without moving. Get up to take short walks every 1-2 hours. This is important. Ask for help if you feel weak or unsteady.  Return to your normal activities as told by your doctor. Ask your doctor what activities are safe for you. To help cramping and bloating:  Try walking around.  Put heat on your belly as told by your doctor. Use the heat source that your doctor recommends, such as a moist heat pack or a heating pad. ? Put a towel between your skin and the heat source. ? Leave the heat on for 20-30 minutes. ? Remove the heat if your skin turns bright red. This is very important if you are unable to feel pain, heat, or cold. You may have a greater risk of getting burned.   General instructions  If you were given a medicine to help you relax (sedative) during your procedure, it can affect you for many hours. Do not drive or use machinery until your doctor says that it is safe.  For the first 24 hours after the procedure: ? Do not sign important documents. ? Do not drink alcohol. ? Do your daily activities more slowly than normal. ? Eat foods that are soft and easy to digest.  Take over-the-counter or prescription medicines only as told by your doctor.  Keep all follow-up visits as  told by your doctor. This is important. Contact a doctor if:  You have blood in your poop 2-3 days after the procedure. Get help right away if:  You have more than a small amount of blood in your poop.  You see large clumps of tissue (blood clots) in your poop.  Your belly is swollen.  You feel like you may vomit (nauseous).  You vomit.  You have a fever.  You have belly pain that gets worse, and medicine does not help your pain. Summary  After the procedure, it is common to have a small amount of blood in your poop. You may also  have mild cramping and bloating in your belly.  If you were given a medicine to help you relax (sedative) during your procedure, it can affect you for many hours. Do not drive or use machinery until your doctor says that it is safe.  Get help right away if you have a lot of blood in your poop, feel like you may vomit, have a fever, or have more belly pain. This information is not intended to replace advice given to you by your health care provider. Make sure you discuss any questions you have with your health care provider. Document Revised: 03/24/2019 Document Reviewed: 12/12/2018 Elsevier Patient Education  [place].   Colon Polyps  Colon polyps are tissue growths inside the colon, which is part of the large intestine. They are one of the types of polyps that can grow in the body. A polyp may be a round bump or a mushroom-shaped growth. You could have one polyp or more than one. Most colon polyps are noncancerous (benign). However, some colon polyps can become cancerous over time. Finding and removing the polyps early can help prevent this. What are the causes? The exact cause of colon polyps is not known. What increases the risk? The following factors may make you more likely to develop this condition:  Having a family history of colorectal cancer or colon polyps.  Being older than 61 years of age.  Being younger than 61 years of age and having a significant family history of colorectal cancer or colon polyps or a genetic condition that puts you at higher risk of getting colon polyps.  Having inflammatory bowel disease, such as ulcerative colitis or Crohn's disease.  Having certain conditions passed from parent to child (hereditary conditions), such as: ? Familial adenomatous polyposis (FAP). ? Lynch syndrome. ? Turcot syndrome. ? Peutz-Jeghers syndrome. ? MUTYH-associated polyposis (MAP).  Being overweight.  Certain lifestyle factors. These include smoking  cigarettes, drinking too much alcohol, not getting enough exercise, and eating a diet that is high in fat and red meat and low in fiber.  Having had childhood cancer that was treated with radiation of the abdomen. What are the signs or symptoms? Many times, there are no symptoms. If you have symptoms, they may include:  Blood coming from the rectum during a bowel movement.  Blood in the stool (feces). The blood may be bright red or very dark in color.  Pain in the abdomen.  A change in bowel habits, such as constipation or diarrhea. How is this diagnosed? This condition is diagnosed with a colonoscopy. This is a procedure in which a lighted, flexible scope is inserted into the opening between the buttocks (anus) and then passed into the colon to examine the area. Polyps are sometimes found when a colonoscopy is done as part of routine cancer screening tests. How is this treated?  This condition is treated by removing any polyps that are found. Most polyps can be removed during a colonoscopy. Those polyps will then be tested for cancer. Additional treatment may be needed depending on the results of testing. Follow these instructions at home: Eating and drinking  Eat foods that are high in fiber, such as fruits, vegetables, and whole grains.  Eat foods that are high in calcium and vitamin D, such as milk, cheese, yogurt, eggs, liver, fish, and broccoli.  Limit foods that are high in fat, such as fried foods and desserts.  Limit the amount of red meat, precooked or cured meat, or other processed meat that you eat, such as hot dogs, sausages, bacon, or meat loaves.  Limit sugary drinks.   Lifestyle  Maintain a healthy weight, or lose weight if recommended by your health care provider.  Exercise every day or as told by your health care provider.  Do not use any products that contain nicotine or tobacco, such as cigarettes, e-cigarettes, and chewing tobacco. If you need help quitting,  ask your health care provider.  Do not drink alcohol if: ? Your health care provider tells you not to drink. ? You are pregnant, may be pregnant, or are planning to become pregnant.  If you drink alcohol: ? Limit how much you use to:  0-1 drink a day for women.  0-2 drinks a day for men. ? Know how much alcohol is in your drink. In the [place] one drink equals one 12 oz bottle of beer (355 mL), one 5 oz glass of wine (148 mL), or one 1 oz glass of hard liquor (44 mL). General instructions  Take over-the-counter and prescription medicines only as told by your health care provider.  Keep all follow-up visits. This is important. This includes having regularly scheduled colonoscopies. Talk to your health care provider about when you need a colonoscopy. Contact a health care provider if:  You have new or worsening bleeding during a bowel movement.  You have new or increased blood in your stool.  You have a change in bowel habits.  You lose weight for no known reason. Summary  Colon polyps are tissue growths inside the colon, which is part of the large intestine. They are one type of polyp that can grow in the body.  Most colon polyps are noncancerous (benign), but some can become cancerous over time.  This condition is diagnosed with a colonoscopy.  This condition is treated by removing any polyps that are found. Most polyps can be removed during a colonoscopy. This information is not intended to replace advice given to you by your health care provider. Make sure you discuss any questions you have with your health care provider. Document Revised: 09/06/2019 Document Reviewed: 09/06/2019 Elsevier Patient Education  2021 [place].    Diverticulosis  Diverticulosis is a condition that develops when small pouches (diverticula) form in the wall of the large intestine (colon). The colon is where water is absorbed and stool (feces) is formed. The pouches form when the inside layer  of the colon pushes through weak spots in the outer layers of the colon. You may have a few pouches or many of them. The pouches usually do not cause problems unless they become inflamed or infected. When this happens, the condition is called diverticulitis. What are the causes? The cause of this condition is not known. What increases the risk? The following factors may make you more likely to develop this condition:  Being older  than age 70. Your risk for this condition increases with age. Diverticulosis is rare among people younger than age 66. By age 73, many people have it.  Eating a low-fiber diet.  Having frequent constipation.  Being overweight.  Not getting enough exercise.  Smoking.  Taking over-the-counter pain medicines, like aspirin and ibuprofen.  Having a family history of diverticulosis. What are the signs or symptoms? In most people, there are no symptoms of this condition. If you do have symptoms, they may include:  Bloating.  Cramps in the abdomen.  Constipation or diarrhea.  Pain in the lower left side of the abdomen. How is this diagnosed? Because diverticulosis usually has no symptoms, it is most often diagnosed during an exam for other colon problems. The condition may be diagnosed by:  Using a flexible scope to examine the colon (colonoscopy).  Taking an X-ray of the colon after dye has been put into the colon (barium enema).  Having a CT scan. How is this treated? You may not need treatment for this condition. Your health care provider may recommend treatment to prevent problems. You may need treatment if you have symptoms or if you previously had diverticulitis. Treatment may include:  Eating a high-fiber diet.  Taking a fiber supplement.  Taking a live bacteria supplement (probiotic).  Taking medicine to relax your colon.   Follow these instructions at home: Medicines  Take over-the-counter and prescription medicines only as told by your  health care provider.  If told by your health care provider, take a fiber supplement or probiotic. Constipation prevention Your condition may cause constipation. To prevent or treat constipation, you may need to:  Drink enough fluid to keep your urine pale yellow.  Take over-the-counter or prescription medicines.  Eat foods that are high in fiber, such as beans, whole grains, and fresh fruits and vegetables.  Limit foods that are high in fat and processed sugars, such as fried or sweet foods.   General instructions  Try not to strain when you have a bowel movement.  Keep all follow-up visits as told by your health care provider. This is important. Contact a health care provider if you:  Have pain in your abdomen.  Have bloating.  Have cramps.  Have not had a bowel movement in 3 days. Get help right away if:  Your pain gets worse.  Your bloating becomes very bad.  You have a fever or chills, and your symptoms suddenly get worse.  You vomit.  You have bowel movements that are bloody or black.  You have bleeding from your rectum. Summary  Diverticulosis is a condition that develops when small pouches (diverticula) form in the wall of the large intestine (colon).  You may have a few pouches or many of them.  This condition is most often diagnosed during an exam for other colon problems.  Treatment may include increasing the fiber in your diet, taking supplements, or taking medicines. This information is not intended to replace advice given to you by your health care provider. Make sure you discuss any questions you have with your health care provider. Document Revised: 12/15/2018 Document Reviewed: 12/15/2018 Elsevier Patient Education  [place].

## 2020-10-08 NOTE — Anesthesia Preprocedure Evaluation (Addendum)
Anesthesia Evaluation  Patient identified by MRN, date of birth, ID band Patient awake    Reviewed: Allergy & Precautions, NPO status , Patient's Chart, lab work & pertinent test results  History of Anesthesia Complications Negative for: history of anesthetic complications  Airway Mallampati: III  TM Distance: >3 FB Neck ROM: Full    Dental  (+) Dental Advisory Given, Teeth Intact   Pulmonary neg pulmonary ROS,    Pulmonary exam normal breath sounds clear to auscultation       Cardiovascular Exercise Tolerance: Good Normal cardiovascular exam Rhythm:Regular Rate:Normal     Neuro/Psych negative neurological ROS  negative psych ROS   GI/Hepatic negative GI ROS, Neg liver ROS,   Endo/Other  diabetes, Well Controlled, Type 2, Oral Hypoglycemic Agents  Renal/GU      Musculoskeletal   Abdominal   Peds  Hematology negative hematology ROS (+)   Anesthesia Other Findings Snoring   Reproductive/Obstetrics                            Anesthesia Physical Anesthesia Plan  ASA: II  Anesthesia Plan: General   Post-op Pain Management:    Induction:   PONV Risk Score and Plan: Propofol infusion  Airway Management Planned: Nasal Cannula and Natural Airway  Additional Equipment:   Intra-op Plan:   Post-operative Plan:   Informed Consent: I have reviewed the patients History and Physical, chart, labs and discussed the procedure including the risks, benefits and alternatives for the proposed anesthesia with the patient or authorized representative who has indicated his/her understanding and acceptance.     Dental advisory given  Plan Discussed with: CRNA and Surgeon  Anesthesia Plan Comments:         Anesthesia Quick Evaluation

## 2020-10-10 LAB — SURGICAL PATHOLOGY

## 2020-10-14 ENCOUNTER — Encounter (HOSPITAL_COMMUNITY): Payer: Self-pay | Admitting: Gastroenterology

## 2020-11-08 ENCOUNTER — Ambulatory Visit: Payer: Commercial Managed Care - PPO | Admitting: Family Medicine

## 2020-11-11 ENCOUNTER — Encounter: Payer: Self-pay | Admitting: Family Medicine

## 2021-04-02 ENCOUNTER — Ambulatory Visit (INDEPENDENT_AMBULATORY_CARE_PROVIDER_SITE_OTHER): Payer: Commercial Managed Care - PPO | Admitting: Family Medicine

## 2021-04-02 ENCOUNTER — Encounter: Payer: Self-pay | Admitting: Family Medicine

## 2021-04-02 ENCOUNTER — Other Ambulatory Visit: Payer: Self-pay

## 2021-04-02 VITALS — BP 106/63 | HR 65 | Temp 97.9°F | Ht 67.0 in | Wt 164.4 lb

## 2021-04-02 DIAGNOSIS — E119 Type 2 diabetes mellitus without complications: Secondary | ICD-10-CM | POA: Diagnosis not present

## 2021-04-02 DIAGNOSIS — Z23 Encounter for immunization: Secondary | ICD-10-CM | POA: Diagnosis not present

## 2021-04-02 DIAGNOSIS — I10 Essential (primary) hypertension: Secondary | ICD-10-CM | POA: Diagnosis not present

## 2021-04-02 DIAGNOSIS — Z13220 Encounter for screening for lipoid disorders: Secondary | ICD-10-CM | POA: Diagnosis not present

## 2021-04-02 LAB — BAYER DCA HB A1C WAIVED: HB A1C (BAYER DCA - WAIVED): 6.7 % — ABNORMAL HIGH (ref 4.8–5.6)

## 2021-04-02 MED ORDER — LISINOPRIL 10 MG PO TABS: 10.0000 mg | ORAL_TABLET | Freq: Every day | ORAL | 3 refills | Status: DC

## 2021-04-02 MED ORDER — BLOOD GLUCOSE MONITOR SYSTEM W/DEVICE KIT: 1.0000 | PACK | Freq: Two times a day (BID) | 0 refills | Status: DC

## 2021-04-02 MED ORDER — METFORMIN HCL ER 750 MG PO TB24
750.0000 mg | ORAL_TABLET | Freq: Two times a day (BID) | ORAL | 3 refills | Status: DC
Start: 2021-04-02 — End: 2022-01-08

## 2021-04-02 NOTE — Addendum Note (Signed)
Addended by: Baldomero Lamy B on: 04/02/2021 02:27 PM   Modules accepted: Orders

## 2021-04-02 NOTE — Progress Notes (Signed)
Subjective:  Patient ID: Cory Morales, male    DOB: Jul 25, 1960  Age: 60 y.o. MRN: [id number]  CC: Medical Management of Chronic Issues   HPI Cory Morales presents forFollow-up of diabetes. Patient checks blood sugar at home.   140-160 fasting Patient denies symptoms such as polyuria, polydipsia, excessive hunger, nausea No significant hypoglycemic spells noted. Medications reviewed. Pt reports taking them regularly without complication/adverse reaction being reported today.  Checking feet daily.   History Cory Morales has a past medical history of Diabetes mellitus without complication ([place]).   He has a past surgical history that includes Inguinal hernia repair (Right, 11/08/2017); Colonoscopy with propofol (N/A, 10/08/2020); and polypectomy (10/08/2020).   His family history includes COPD in his mother; Diabetes in his mother; Heart disease in his mother.He reports that he has never smoked. He has never used smokeless tobacco. He reports that he does not drink alcohol and does not use drugs.  Current Outpatient Medications on File Prior to Visit  Medication Sig Dispense Refill   acetaminophen (TYLENOL) 325 MG tablet Take 650 mg by mouth every 6 (six) hours as needed (pain).     glucose blood (CONTOUR NEXT TEST) test strip Test BS BID Dx E11.9 200 strip 3   ibuprofen (ADVIL,MOTRIN) 200 MG tablet Take 200 mg by mouth every 8 (eight) hours as needed (pain).     Lancets Misc. MISC Use to check blood sugars 100 each 3   No current facility-administered medications on file prior to visit.    ROS Review of Systems  Constitutional:  Negative for fever.  Respiratory:  Negative for shortness of breath.   Cardiovascular:  Negative for chest pain.  Musculoskeletal:  Negative for arthralgias.  Skin:  Negative for rash.   Objective:  BP 106/63   Pulse 65   Temp 97.9 F (36.6 C)   Ht 5' 7"  (1.702 m)   Wt 164 lb 6.4 oz (74.6 kg)   SpO2 97%   BMI 25.75 kg/m   BP Readings from Last  3 Encounters:  04/02/21 106/63  10/08/20 (!) 100/55  08/08/20 115/70    Wt Readings from Last 3 Encounters:  04/02/21 164 lb 6.4 oz (74.6 kg)  08/08/20 168 lb 12.8 oz (76.6 kg)  05/14/20 171 lb (77.6 kg)     Physical Exam Vitals reviewed.  Constitutional:      Appearance: He is well-developed.  HENT:     Head: Normocephalic and atraumatic.     Right Ear: External ear normal.     Left Ear: External ear normal.     Mouth/Throat:     Pharynx: No oropharyngeal exudate or posterior oropharyngeal erythema.  Eyes:     Pupils: Pupils are equal, round, and reactive to light.  Cardiovascular:     Rate and Rhythm: Normal rate and regular rhythm.     Heart sounds: No murmur heard. Pulmonary:     Effort: No respiratory distress.     Breath sounds: Normal breath sounds.  Musculoskeletal:     Cervical back: Normal range of motion and neck supple.  Neurological:     Mental Status: He is alert and oriented to person, place, and time.    Diabetic Foot Exam - Simple   Simple Foot Form Diabetic Foot exam was performed with the following findings: Yes 04/02/2021 11:43 AM  Visual Inspection No deformities, no ulcerations, no other skin breakdown bilaterally: Yes Sensation Testing Intact to touch and monofilament testing bilaterally: Yes Pulse Check Posterior Tibialis and Dorsalis pulse  intact bilaterally: Yes Comments      Assessment & Plan:   Cory Morales was seen today for medical management of chronic issues.  Diagnoses and all orders for this visit:  Type 2 diabetes mellitus without complication, without long-term current use of insulin (HCC) -     Bayer DCA Hb A1c Waived -     CBC with Differential/Platelet  Hypertension, unspecified type -     CMP14+EGFR  Lipid screening -     Lipid panel  Newly diagnosed diabetes ([place]) -     lisinopril (ZESTRIL) 10 MG tablet; Take 1 tablet (10 mg total) by mouth daily. -     metFORMIN (GLUCOPHAGE-XR) 750 MG 24 hr tablet; Take 1 tablet  (750 mg total) by mouth in the morning and at bedtime.  Other orders -     Discontinue: Blood Glucose Monitoring Suppl (BLOOD GLUCOSE MONITOR SYSTEM) w/Device KIT; 1 Device by Does not apply route 2 (two) times daily. -     Discontinue: Blood Glucose Monitoring Suppl (BLOOD GLUCOSE MONITOR SYSTEM) w/Device KIT; 1 Device by Does not apply route 2 (two) times daily. -     Blood Glucose Monitoring Suppl (BLOOD GLUCOSE MONITOR SYSTEM) w/Device KIT; 1 Device by Does not apply route 2 (two) times daily. -     Blood Glucose Monitoring Suppl (BLOOD GLUCOSE MONITOR SYSTEM) w/Device KIT; 1 Device by Does not apply route 2 (two) times daily. -     Blood Glucose Monitoring Suppl (BLOOD GLUCOSE MONITOR SYSTEM) w/Device KIT; 1 Device by Does not apply route 2 (two) times daily.     I have discontinued Cory Morales's dapagliflozin propanediol and terbinafine. I am also having him maintain his acetaminophen, ibuprofen, Lancets Misc., Contour Next Test, lisinopril, metFORMIN, Blood Glucose Monitor System, Blood Glucose Monitor System, and Blood Glucose Monitor System.  Meds ordered this encounter  Medications   lisinopril (ZESTRIL) 10 MG tablet    Sig: Take 1 tablet (10 mg total) by mouth daily.    Dispense:  90 tablet    Refill:  3   metFORMIN (GLUCOPHAGE-XR) 750 MG 24 hr tablet    Sig: Take 1 tablet (750 mg total) by mouth in the morning and at bedtime.    Dispense:  180 tablet    Refill:  3   DISCONTD: Blood Glucose Monitoring Suppl (BLOOD GLUCOSE MONITOR SYSTEM) w/Device KIT    Sig: 1 Device by Does not apply route 2 (two) times daily.    Dispense:  1 kit    Refill:  0   DISCONTD: Blood Glucose Monitoring Suppl (BLOOD GLUCOSE MONITOR SYSTEM) w/Device KIT    Sig: 1 Device by Does not apply route 2 (two) times daily.    Dispense:  1 kit    Refill:  0   Blood Glucose Monitoring Suppl (BLOOD GLUCOSE MONITOR SYSTEM) w/Device KIT    Sig: 1 Device by Does not apply route 2 (two) times daily.     Dispense:  1 kit    Refill:  0   Blood Glucose Monitoring Suppl (BLOOD GLUCOSE MONITOR SYSTEM) w/Device KIT    Sig: 1 Device by Does not apply route 2 (two) times daily.    Dispense:  1 kit    Refill:  0   Blood Glucose Monitoring Suppl (BLOOD GLUCOSE MONITOR SYSTEM) w/Device KIT    Sig: 1 Device by Does not apply route 2 (two) times daily.    Dispense:  1 kit    Refill:  0  Follow-up: Return in about 3 months (around 07/03/2021).  Claretta Fraise, M.D.

## 2021-04-03 ENCOUNTER — Other Ambulatory Visit: Payer: Self-pay | Admitting: Family Medicine

## 2021-04-03 LAB — CBC WITH DIFFERENTIAL/PLATELET
Basophils Absolute: 0.1 10*3/uL (ref 0.0–0.2)
Basos: 1 %
EOS (ABSOLUTE): 0.3 10*3/uL (ref 0.0–0.4)
Eos: 5 %
Hematocrit: 39.1 % (ref 37.5–51.0)
Hemoglobin: 13.4 g/dL (ref 13.0–17.7)
Immature Grans (Abs): 0 10*3/uL (ref 0.0–0.1)
Immature Granulocytes: 0 %
Lymphocytes Absolute: 1.4 10*3/uL (ref 0.7–3.1)
Lymphs: 22 %
MCH: 29.5 pg (ref 26.6–33.0)
MCHC: 34.3 g/dL (ref 31.5–35.7)
MCV: 86 fL (ref 79–97)
Monocytes Absolute: 0.5 10*3/uL (ref 0.1–0.9)
Monocytes: 9 %
Neutrophils Absolute: 4.1 10*3/uL (ref 1.4–7.0)
Neutrophils: 63 %
Platelets: 318 10*3/uL (ref 150–450)
RBC: 4.55 x10E6/uL (ref 4.14–5.80)
RDW: 12.5 % (ref 11.6–15.4)
WBC: 6.4 10*3/uL (ref 3.4–10.8)

## 2021-04-03 LAB — CMP14+EGFR
ALT: 14 IU/L (ref 0–44)
AST: 16 IU/L (ref 0–40)
Albumin/Globulin Ratio: 1.9 (ref 1.2–2.2)
Albumin: 4.4 g/dL (ref 3.8–4.8)
Alkaline Phosphatase: 64 IU/L (ref 44–121)
BUN/Creatinine Ratio: 16 (ref 10–24)
BUN: 15 mg/dL (ref 8–27)
Bilirubin Total: 0.4 mg/dL (ref 0.0–1.2)
CO2: 24 mmol/L (ref 20–29)
Calcium: 9.6 mg/dL (ref 8.6–10.2)
Chloride: 104 mmol/L (ref 96–106)
Creatinine, Ser: 0.93 mg/dL (ref 0.76–1.27)
Globulin, Total: 2.3 g/dL (ref 1.5–4.5)
Glucose: 115 mg/dL — ABNORMAL HIGH (ref 70–99)
Potassium: 4.9 mmol/L (ref 3.5–5.2)
Sodium: 142 mmol/L (ref 134–144)
Total Protein: 6.7 g/dL (ref 6.0–8.5)
eGFR: 93 mL/min/{1.73_m2} (ref >59)

## 2021-04-03 LAB — LIPID PANEL
Chol/HDL Ratio: 4.5 ratio (ref 0.0–5.0)
Cholesterol, Total: 186 mg/dL (ref 100–199)
HDL: 41 mg/dL (ref >39)
LDL Chol Calc (NIH): 120 mg/dL — ABNORMAL HIGH (ref 0–99)
Triglycerides: 138 mg/dL (ref 0–149)
VLDL Cholesterol Cal: 25 mg/dL (ref 5–40)

## 2021-04-03 MED ORDER — ROSUVASTATIN CALCIUM 5 MG PO TABS: 5.0000 mg | ORAL_TABLET | Freq: Every day | ORAL | 3 refills | Status: DC

## 2021-07-07 ENCOUNTER — Encounter: Payer: Self-pay | Admitting: Family Medicine

## 2021-07-07 ENCOUNTER — Ambulatory Visit (INDEPENDENT_AMBULATORY_CARE_PROVIDER_SITE_OTHER): Payer: Commercial Managed Care - PPO | Admitting: Family Medicine

## 2021-07-07 VITALS — BP 116/67 | HR 73 | Temp 97.2°F | Ht 67.0 in | Wt 162.8 lb

## 2021-07-07 DIAGNOSIS — Z23 Encounter for immunization: Secondary | ICD-10-CM

## 2021-07-07 DIAGNOSIS — E119 Type 2 diabetes mellitus without complications: Secondary | ICD-10-CM | POA: Diagnosis not present

## 2021-07-07 DIAGNOSIS — Z13220 Encounter for screening for lipoid disorders: Secondary | ICD-10-CM

## 2021-07-07 DIAGNOSIS — Z125 Encounter for screening for malignant neoplasm of prostate: Secondary | ICD-10-CM | POA: Diagnosis not present

## 2021-07-07 DIAGNOSIS — N521 Erectile dysfunction due to diseases classified elsewhere: Secondary | ICD-10-CM

## 2021-07-07 DIAGNOSIS — I10 Essential (primary) hypertension: Secondary | ICD-10-CM

## 2021-07-07 LAB — LIPID PANEL

## 2021-07-07 LAB — BAYER DCA HB A1C WAIVED: HB A1C (BAYER DCA - WAIVED): 7.2 % — ABNORMAL HIGH (ref 4.8–5.6)

## 2021-07-07 MED ORDER — SILDENAFIL CITRATE 20 MG PO TABS: ORAL_TABLET | ORAL | 3 refills | Status: DC

## 2021-07-07 NOTE — Progress Notes (Signed)
Subjective:  Patient ID: Cory Morales,  male    DOB: Oct 03, 1960  Age: 61 y.o.    CC: Medical Management of Chronic Issues   HPI Cory Morales presents for  follow-up of hypertension. Patient has no history of headache chest pain or shortness of breath or recent cough. Patient also denies symptoms of TIA such as numbness weakness lateralizing. Patient denies side effects from medication. States taking it regularly.  Patient also  in for follow-up of elevated cholesterol. Doing well without complaints on current medication. Denies side effects  including myalgia and arthralgia and nausea. Also in today for liver function testing. Currently no chest pain, shortness of breath or other cardiovascular related symptoms noted.  Follow-up of diabetes. Patient does check blood sugar at home. Readings run between 150-160 fasting and not checking prandial  Patient denies symptoms such as excessive hunger or urinary frequency, excessive hunger, nausea No significant hypoglycemic spells noted. Medications reviewed. Pt reports taking them regularly. Pt. denies complication/adverse reaction today.    History Cory Morales has a past medical history of Diabetes mellitus without complication ([place]).   He has a past surgical history that includes Inguinal hernia repair (Right, 11/08/2017); Colonoscopy with propofol (N/A, 10/08/2020); and polypectomy (10/08/2020).   His family history includes COPD in his mother; Diabetes in his mother; Heart disease in his mother.He reports that he has never smoked. He has never used smokeless tobacco. He reports that he does not drink alcohol and does not use drugs.  Current Outpatient Medications on File Prior to Visit  Medication Sig Dispense Refill   acetaminophen (TYLENOL) 325 MG tablet Take 650 mg by mouth every 6 (six) hours as needed (pain).     ibuprofen (ADVIL,MOTRIN) 200 MG tablet Take 200 mg by mouth every 8 (eight) hours as needed (pain).     Lancets Misc.  MISC Use to check blood sugars 100 each 3   lisinopril (ZESTRIL) 10 MG tablet Take 1 tablet (10 mg total) by mouth daily. 90 tablet 3   metFORMIN (GLUCOPHAGE-XR) 750 MG 24 hr tablet Take 1 tablet (750 mg total) by mouth in the morning and at bedtime. 180 tablet 3   rosuvastatin (CRESTOR) 5 MG tablet Take 1 tablet (5 mg total) by mouth daily. For cholesterol 90 tablet 3   No current facility-administered medications on file prior to visit.    ROS Review of Systems  Constitutional:  Negative for fever.  Respiratory:  Negative for shortness of breath.   Cardiovascular:  Negative for chest pain.  Musculoskeletal:  Negative for arthralgias.  Skin:  Negative for rash.   Objective:  BP 116/67    Pulse 73    Temp (!) 97.2 F (36.2 C)    Ht _0  (1.702 m)    Wt 162 lb 12.8 oz (73.8 kg)    SpO2 100%    BMI 25.50 kg/m   BP Readings from Last 3 Encounters:  07/07/21 116/67  04/02/21 106/63  10/08/20 (!) 100/55    Wt Readings from Last 3 Encounters:  07/07/21 162 lb 12.8 oz (73.8 kg)  04/02/21 164 lb 6.4 oz (74.6 kg)  08/08/20 168 lb 12.8 oz (76.6 kg)     Physical Exam Constitutional:      General: He is not in acute distress.    Appearance: He is well-developed.  HENT:     Head: Normocephalic and atraumatic.     Right Ear: External ear normal.     Left Ear: External ear normal.  Nose: Nose normal.  Eyes:     Conjunctiva/sclera: Conjunctivae normal.     Pupils: Pupils are equal, round, and reactive to light.  Cardiovascular:     Rate and Rhythm: Normal rate and regular rhythm.     Heart sounds: Normal heart sounds. No murmur heard. Pulmonary:     Effort: Pulmonary effort is normal. No respiratory distress.     Breath sounds: Normal breath sounds. No wheezing or rales.  Abdominal:     Palpations: Abdomen is soft.     Tenderness: There is no abdominal tenderness.  Musculoskeletal:        General: Normal range of motion.     Cervical back: Normal range of motion and neck  supple.  Skin:    General: Skin is warm and dry.  Neurological:     Mental Status: He is alert and oriented to person, place, and time.     Deep Tendon Reflexes: Reflexes are normal and symmetric.  Psychiatric:        Behavior: Behavior normal.        Thought Content: Thought content normal.        Judgment: Judgment normal.    Diabetic Foot Exam - Simple   No data filed       Assessment & Plan:   Cory Morales was seen today for medical management of chronic issues.  Diagnoses and all orders for this visit:  Type 2 diabetes mellitus without complication, without long-term current use of insulin (HCC) -     Bayer DCA Hb A1c Waived  Hypertension, unspecified type -     CBC with Differential/Platelet -     CMP14+EGFR  Lipid screening -     Lipid panel  Prostate cancer screening -     PSA, total and free  Need for shingles vaccine -     Varicella-zoster vaccine IM (Shingrix)  Erectile dysfunction due to diseases classified elsewhere  Other orders -     sildenafil (REVATIO) 20 MG tablet; Take 2-5 pills at once, orally, with each sexual encounter   I have discontinued [name]'s Contour Next Test, Blood Glucose Monitor System, Blood Glucose Monitor System, and Blood Glucose Monitor System. I am also having him start on sildenafil. Additionally, I am having him maintain his acetaminophen, ibuprofen, Lancets Misc., lisinopril, metFORMIN, and rosuvastatin.  Meds ordered this encounter  Medications   sildenafil (REVATIO) 20 MG tablet    Sig: Take 2-5 pills at once, orally, with each sexual encounter    Dispense:  100 tablet    Refill:  3     Follow-up: Return in about 3 months (around 10/04/2021).  Claretta Fraise, M.D.

## 2021-07-07 NOTE — Addendum Note (Signed)
Addended by: Claretta Fraise on: 07/07/2021 09:27 AM   Modules accepted: Orders

## 2021-07-08 LAB — CBC WITH DIFFERENTIAL/PLATELET
Basophils Absolute: 0.1 10*3/uL (ref 0.0–0.2)
Basos: 1 %
EOS (ABSOLUTE): 0.5 10*3/uL — ABNORMAL HIGH (ref 0.0–0.4)
Eos: 7 %
Hematocrit: 40 % (ref 37.5–51.0)
Hemoglobin: 13.3 g/dL (ref 13.0–17.7)
Immature Grans (Abs): 0 10*3/uL (ref 0.0–0.1)
Immature Granulocytes: 0 %
Lymphocytes Absolute: 1.3 10*3/uL (ref 0.7–3.1)
Lymphs: 17 %
MCH: 28.5 pg (ref 26.6–33.0)
MCHC: 33.3 g/dL (ref 31.5–35.7)
MCV: 86 fL (ref 79–97)
Monocytes Absolute: 0.6 10*3/uL (ref 0.1–0.9)
Monocytes: 8 %
Neutrophils Absolute: 5 10*3/uL (ref 1.4–7.0)
Neutrophils: 67 %
Platelets: 303 10*3/uL (ref 150–450)
RBC: 4.67 x10E6/uL (ref 4.14–5.80)
RDW: 12.7 % (ref 11.6–15.4)
WBC: 7.6 10*3/uL (ref 3.4–10.8)

## 2021-07-08 LAB — CMP14+EGFR
ALT: 25 IU/L (ref 0–44)
AST: 19 IU/L (ref 0–40)
Albumin/Globulin Ratio: 2.2 (ref 1.2–2.2)
Albumin: 4.6 g/dL (ref 3.8–4.8)
Alkaline Phosphatase: 75 IU/L (ref 44–121)
BUN/Creatinine Ratio: 14 (ref 10–24)
BUN: 13 mg/dL (ref 8–27)
Bilirubin Total: 0.4 mg/dL (ref 0.0–1.2)
CO2: 19 mmol/L — ABNORMAL LOW (ref 20–29)
Calcium: 9.6 mg/dL (ref 8.6–10.2)
Chloride: 102 mmol/L (ref 96–106)
Creatinine, Ser: 0.91 mg/dL (ref 0.76–1.27)
Globulin, Total: 2.1 g/dL (ref 1.5–4.5)
Glucose: 148 mg/dL — ABNORMAL HIGH (ref 70–99)
Potassium: 5.2 mmol/L (ref 3.5–5.2)
Sodium: 141 mmol/L (ref 134–144)
Total Protein: 6.7 g/dL (ref 6.0–8.5)
eGFR: 96 mL/min/{1.73_m2} (ref >59)

## 2021-07-08 LAB — PSA, TOTAL AND FREE
PSA, Free Pct: 28 %
PSA, Free: 0.14 ng/mL
Prostate Specific Ag, Serum: 0.5 ng/mL (ref 0.0–4.0)

## 2021-07-08 LAB — LIPID PANEL
Chol/HDL Ratio: 3.1 ratio (ref 0.0–5.0)
Cholesterol, Total: 133 mg/dL (ref 100–199)
HDL: 43 mg/dL (ref >39)
LDL Chol Calc (NIH): 75 mg/dL (ref 0–99)
Triglycerides: 78 mg/dL (ref 0–149)
VLDL Cholesterol Cal: 15 mg/dL (ref 5–40)

## 2021-07-17 NOTE — Progress Notes (Signed)
Hello Cordai,  Your lab result is normal and/or stable.Some minor variations that are not significant are commonly marked abnormal, but do not represent any medical problem for you.  Best regards, Jaekwon Mcclune, M.D.

## 2021-10-06 ENCOUNTER — Ambulatory Visit: Payer: Commercial Managed Care - PPO | Admitting: Family Medicine

## 2021-11-25 ENCOUNTER — Ambulatory Visit: Payer: Commercial Managed Care - PPO | Admitting: Family Medicine

## 2022-01-08 ENCOUNTER — Ambulatory Visit (INDEPENDENT_AMBULATORY_CARE_PROVIDER_SITE_OTHER): Payer: Commercial Managed Care - PPO | Admitting: Family Medicine

## 2022-01-08 ENCOUNTER — Encounter: Payer: Self-pay | Admitting: Family Medicine

## 2022-01-08 VITALS — BP 117/66 | HR 74 | Temp 97.9°F | Ht 67.0 in | Wt 160.0 lb

## 2022-01-08 DIAGNOSIS — Z13220 Encounter for screening for lipoid disorders: Secondary | ICD-10-CM

## 2022-01-08 DIAGNOSIS — I10 Essential (primary) hypertension: Secondary | ICD-10-CM | POA: Diagnosis not present

## 2022-01-08 DIAGNOSIS — E119 Type 2 diabetes mellitus without complications: Secondary | ICD-10-CM | POA: Diagnosis not present

## 2022-01-08 LAB — CMP14+EGFR
ALT: 17 IU/L (ref 0–44)
AST: 19 IU/L (ref 0–40)
Albumin/Globulin Ratio: 1.9 (ref 1.2–2.2)
Albumin: 4.5 g/dL (ref 3.9–4.9)
Alkaline Phosphatase: 54 IU/L (ref 44–121)
BUN/Creatinine Ratio: 17 (ref 10–24)
BUN: 17 mg/dL (ref 8–27)
Bilirubin Total: 0.6 mg/dL (ref 0.0–1.2)
CO2: 21 mmol/L (ref 20–29)
Calcium: 9.3 mg/dL (ref 8.6–10.2)
Chloride: 104 mmol/L (ref 96–106)
Creatinine, Ser: 1.02 mg/dL (ref 0.76–1.27)
Globulin, Total: 2.4 g/dL (ref 1.5–4.5)
Glucose: 167 mg/dL — ABNORMAL HIGH (ref 70–99)
Potassium: 4.8 mmol/L (ref 3.5–5.2)
Sodium: 138 mmol/L (ref 134–144)
Total Protein: 6.9 g/dL (ref 6.0–8.5)
eGFR: 83 mL/min/{1.73_m2} (ref >59)

## 2022-01-08 LAB — CBC WITH DIFFERENTIAL/PLATELET
Basophils Absolute: 0.1 10*3/uL (ref 0.0–0.2)
Basos: 1 %
EOS (ABSOLUTE): 0.4 10*3/uL (ref 0.0–0.4)
Eos: 7 %
Hematocrit: 40.6 % (ref 37.5–51.0)
Hemoglobin: 13.7 g/dL (ref 13.0–17.7)
Immature Grans (Abs): 0 10*3/uL (ref 0.0–0.1)
Immature Granulocytes: 0 %
Lymphocytes Absolute: 1.4 10*3/uL (ref 0.7–3.1)
Lymphs: 23 %
MCH: 29.6 pg (ref 26.6–33.0)
MCHC: 33.7 g/dL (ref 31.5–35.7)
MCV: 88 fL (ref 79–97)
Monocytes Absolute: 0.5 10*3/uL (ref 0.1–0.9)
Monocytes: 8 %
Neutrophils Absolute: 3.7 10*3/uL (ref 1.4–7.0)
Neutrophils: 61 %
Platelets: 288 10*3/uL (ref 150–450)
RBC: 4.63 x10E6/uL (ref 4.14–5.80)
RDW: 12.1 % (ref 11.6–15.4)
WBC: 6.1 10*3/uL (ref 3.4–10.8)

## 2022-01-08 LAB — LIPID PANEL
Chol/HDL Ratio: 2.6 ratio (ref 0.0–5.0)
Cholesterol, Total: 127 mg/dL (ref 100–199)
HDL: 48 mg/dL (ref >39)
LDL Chol Calc (NIH): 61 mg/dL (ref 0–99)
Triglycerides: 95 mg/dL (ref 0–149)
VLDL Cholesterol Cal: 18 mg/dL (ref 5–40)

## 2022-01-08 LAB — BAYER DCA HB A1C WAIVED: HB A1C (BAYER DCA - WAIVED): 7.7 % — ABNORMAL HIGH (ref 4.8–5.6)

## 2022-01-08 MED ORDER — ROSUVASTATIN CALCIUM 5 MG PO TABS: 5.0000 mg | ORAL_TABLET | Freq: Every day | ORAL | 2 refills | Status: DC

## 2022-01-08 MED ORDER — METFORMIN HCL ER 750 MG PO TB24: 750.0000 mg | ORAL_TABLET | Freq: Two times a day (BID) | ORAL | 2 refills | Status: DC

## 2022-01-08 MED ORDER — LISINOPRIL 10 MG PO TABS: 10.0000 mg | ORAL_TABLET | Freq: Every day | ORAL | 2 refills | Status: DC

## 2022-01-08 NOTE — Progress Notes (Signed)
 Subjective:  Patient ID: Cory Morales,  male    DOB: 06/15/1960  Age: 61 y.o.    CC: Medical Management of Chronic Issues   HPI Cory Morales presents for  follow-up of hypertension. Patient has no history of headache chest pain or shortness of breath or recent cough. Patient also denies symptoms of TIA such as numbness weakness lateralizing. Patient denies side effects from medication. States taking it regularly.  Patient also  in for follow-up of elevated cholesterol. Doing well without complaints on current medication. Denies side effects  including myalgia and arthralgia and nausea. Also in today for liver function testing. Currently no chest pain, shortness of breath or other cardiovascular related symptoms noted.  Follow-up of diabetes. Patient does check blood sugar at home. Readings run between 140 and 190 Patient denies symptoms such as excessive hunger or urinary frequency, excessive hunger, nausea No significant hypoglycemic spells noted. Medications reviewed. Pt reports taking them regularly. Pt. denies complication/adverse reaction today.    History Cory Morales has a past medical history of Diabetes mellitus without complication (HCC).   He has a past surgical history that includes Inguinal hernia repair (Right, 11/08/2017); Colonoscopy with propofol (N/A, 10/08/2020); and polypectomy (10/08/2020).   His family history includes COPD in his mother; Diabetes in his mother; Heart disease in his mother.He reports that he has never smoked. He has never used smokeless tobacco. He reports that he does not drink alcohol and does not use drugs.  Current Outpatient Medications on File Prior to Visit  Medication Sig Dispense Refill   acetaminophen (TYLENOL) 325 MG tablet Take 650 mg by mouth every 6 (six) hours as needed (pain).     ibuprofen (ADVIL,MOTRIN) 200 MG tablet Take 200 mg by mouth every 8 (eight) hours as needed (pain).     Lancets Misc. MISC Use to check blood sugars 100  each 3   sildenafil (REVATIO) 20 MG tablet Take 2-5 pills at once, orally, with each sexual encounter 100 tablet 3   No current facility-administered medications on file prior to visit.    ROS Review of Systems  Constitutional:  Negative for fever.  Respiratory:  Negative for shortness of breath.   Cardiovascular:  Negative for chest pain.  Musculoskeletal:  Negative for arthralgias.  Skin:  Negative for rash.    Objective:  BP 117/66   Pulse 74   Temp 97.9 F (36.6 C)   Ht 5' 7" (1.702 m)   Wt 160 lb (72.6 kg)   BMI 25.06 kg/m   BP Readings from Last 3 Encounters:  01/08/22 117/66  07/07/21 116/67  04/02/21 106/63       Physical Exam Vitals reviewed.  Constitutional:      Appearance: He is well-developed.  HENT:     Head: Normocephalic and atraumatic.     Right Ear: External ear normal.     Left Ear: External ear normal.     Mouth/Throat:     Pharynx: No oropharyngeal exudate or posterior oropharyngeal erythema.  Eyes:     Pupils: Pupils are equal, round, and reactive to light.  Cardiovascular:     Rate and Rhythm: Normal rate and regular rhythm.     Heart sounds: No murmur heard. Pulmonary:     Effort: No respiratory distress.     Breath sounds: Normal breath sounds.  Musculoskeletal:     Cervical back: Normal range of motion and neck supple.  Neurological:     Mental Status: He is alert and oriented to   person, place, and time.     Diabetic Foot Exam - Simple   No data filed     Lab Results  Component Value Date   HGBA1C 7.2 (H) 07/07/2021   HGBA1C 6.7 (H) 04/02/2021   HGBA1C 8.2 (H) 08/08/2020    Assessment & Plan:   Jasman was seen today for medical management of chronic issues.  Diagnoses and all orders for this visit:  Type 2 diabetes mellitus without complication, without long-term current use of insulin (HCC) -     Bayer DCA Hb A1c Waived  Hypertension, unspecified type -     CBC with Differential/Platelet -      CMP14+EGFR  Lipid screening -     Lipid panel  Newly diagnosed diabetes ([place]) -     lisinopril (ZESTRIL) 10 MG tablet; Take 1 tablet (10 mg total) by mouth daily. -     metFORMIN (GLUCOPHAGE-XR) 750 MG 24 hr tablet; Take 1 tablet (750 mg total) by mouth in the morning and at bedtime.  Other orders -     rosuvastatin (CRESTOR) 5 MG tablet; Take 1 tablet (5 mg total) by mouth daily. For cholesterol   I am having Cory Morales maintain his acetaminophen, ibuprofen, Lancets Misc., sildenafil, lisinopril, metFORMIN, and rosuvastatin.  A1c now higher at 7.7. We discussed decreasing starches including bread and potatoes. Also work on exercise  (As opposed to exertion)  Follow-up: Return in about 3 months (around 04/10/2022).  Cory Morales, M.D.

## 2022-04-16 ENCOUNTER — Ambulatory Visit: Payer: Commercial Managed Care - PPO | Admitting: Family Medicine

## 2022-06-03 ENCOUNTER — Ambulatory Visit: Payer: Commercial Managed Care - PPO | Admitting: Family Medicine

## 2022-07-15 ENCOUNTER — Ambulatory Visit (INDEPENDENT_AMBULATORY_CARE_PROVIDER_SITE_OTHER): Payer: Commercial Managed Care - PPO | Admitting: Family Medicine

## 2022-07-15 ENCOUNTER — Encounter: Payer: Self-pay | Admitting: Family Medicine

## 2022-07-15 VITALS — BP 118/70 | HR 67 | Temp 97.0°F | Ht 67.0 in | Wt 164.2 lb

## 2022-07-15 DIAGNOSIS — Z13220 Encounter for screening for lipoid disorders: Secondary | ICD-10-CM

## 2022-07-15 DIAGNOSIS — E119 Type 2 diabetes mellitus without complications: Secondary | ICD-10-CM | POA: Diagnosis not present

## 2022-07-15 DIAGNOSIS — I10 Essential (primary) hypertension: Secondary | ICD-10-CM | POA: Diagnosis not present

## 2022-07-15 DIAGNOSIS — Z125 Encounter for screening for malignant neoplasm of prostate: Secondary | ICD-10-CM

## 2022-07-15 LAB — BAYER DCA HB A1C WAIVED: HB A1C (BAYER DCA - WAIVED): 9.3 % — ABNORMAL HIGH (ref 4.8–5.6)

## 2022-07-15 MED ORDER — DAPAGLIFLOZIN PROPANEDIOL 10 MG PO TABS: 10.0000 mg | ORAL_TABLET | Freq: Every morning | ORAL | 3 refills | Status: DC

## 2022-07-15 NOTE — Progress Notes (Signed)
Subjective:  Patient ID: Cory Morales,  male    DOB: 1960/11/27  Age: 62 y.o.    CC: Medical Management of Chronic Issues   HPI Cory Morales presents for  follow-up of hypertension. Patient has no history of headache chest pain or shortness of breath or recent cough. Patient also denies symptoms of TIA such as numbness weakness lateralizing. Patient denies side effects from medication. States taking it regularly.  Patient also  in for follow-up of elevated cholesterol. Doing well without complaints on current medication. Denies side effects  including myalgia and arthralgia and nausea. Also in today for liver function testing. Currently no chest pain, shortness of breath or other cardiovascular related symptoms noted.  Follow-up of diabetes. Patient does check blood sugar at home. Staying up more Readings run between 204 this AM and 180 Patient denies symptoms such as excessive hunger or urinary frequency, excessive hunger, nausea No significant hypoglycemic spells noted. Medications reviewed. Pt reports taking them regularly. Pt. denies complication/adverse reaction today.    History Cory Morales has a past medical history of Diabetes mellitus without complication ([place]).   He has a past surgical history that includes Inguinal hernia repair (Right, 11/08/2017); Colonoscopy with propofol (N/A, 10/08/2020); and polypectomy (10/08/2020).   His family history includes COPD in his mother; Diabetes in his mother; Heart disease in his mother.He reports that he has never smoked. He has never used smokeless tobacco. He reports that he does not drink alcohol and does not use drugs.  Current Outpatient Medications on File Prior to Visit  Medication Sig Dispense Refill   acetaminophen (TYLENOL) 325 MG tablet Take 650 mg by mouth every 6 (six) hours as needed (pain).     ibuprofen (ADVIL,MOTRIN) 200 MG tablet Take 200 mg by mouth every 8 (eight) hours as needed (pain).     Lancets Misc. MISC Use  to check blood sugars 100 each 3   lisinopril (ZESTRIL) 10 MG tablet Take 1 tablet (10 mg total) by mouth daily. 90 tablet 2   metFORMIN (GLUCOPHAGE-XR) 750 MG 24 hr tablet Take 1 tablet (750 mg total) by mouth in the morning and at bedtime. 180 tablet 2   rosuvastatin (CRESTOR) 5 MG tablet Take 1 tablet (5 mg total) by mouth daily. For cholesterol 90 tablet 2   sildenafil (REVATIO) 20 MG tablet Take 2-5 pills at once, orally, with each sexual encounter 100 tablet 3   No current facility-administered medications on file prior to visit.    ROS Review of Systems  Constitutional:  Negative for fever.  Respiratory:  Negative for shortness of breath.   Cardiovascular:  Negative for chest pain.  Musculoskeletal:  Negative for arthralgias.  Skin:  Negative for rash.    Objective:  BP 118/70   Pulse 67   Temp (!) 97 F (36.1 C)   Ht 5' 7"$  (1.702 m)   Wt 164 lb 3.2 oz (74.5 kg)   SpO2 97%   BMI 25.72 kg/m   BP Readings from Last 3 Encounters:  07/15/22 118/70  01/08/22 117/66  07/07/21 116/67    Wt Readings from Last 3 Encounters:  07/15/22 164 lb 3.2 oz (74.5 kg)  01/08/22 160 lb (72.6 kg)  07/07/21 162 lb 12.8 oz (73.8 kg)     Physical Exam Vitals reviewed.  Constitutional:      Appearance: He is well-developed.  HENT:     Head: Normocephalic and atraumatic.     Right Ear: External ear normal.  Left Ear: External ear normal.     Mouth/Throat:     Pharynx: No oropharyngeal exudate or posterior oropharyngeal erythema.  Eyes:     Pupils: Pupils are equal, round, and reactive to light.  Cardiovascular:     Rate and Rhythm: Normal rate and regular rhythm.     Heart sounds: No murmur heard. Pulmonary:     Effort: No respiratory distress.     Breath sounds: Normal breath sounds.  Musculoskeletal:     Cervical back: Normal range of motion and neck supple.  Neurological:     Mental Status: He is alert and oriented to person, place, and time.     Diabetic Foot  Exam - Simple   No data filed     Lab Results  Component Value Date   HGBA1C 9.3 (H) 07/15/2022   HGBA1C 7.7 (H) 01/08/2022   HGBA1C 7.2 (H) 07/07/2021    Assessment & Plan:   Cory Morales was seen today for medical management of chronic issues.  Diagnoses and all orders for this visit:  Type 2 diabetes mellitus without complication, without long-term current use of insulin (HCC) -     Microalbumin / creatinine urine ratio -     Bayer DCA Hb A1c Waived  Hypertension, unspecified type -     CBC with Differential/Platelet -     CMP14+EGFR  Lipid screening -     Lipid panel  Prostate cancer screening -     PSA, total and free  Other orders -     dapagliflozin propanediol (FARXIGA) 10 MG TABS tablet; Take 1 tablet (10 mg total) by mouth in the morning.   I have changed Cory Morales's dapagliflozin propanediol. I am also having him maintain his acetaminophen, ibuprofen, Lancets Misc., sildenafil, lisinopril, metFORMIN, and rosuvastatin.  Meds ordered this encounter  Medications   dapagliflozin propanediol (FARXIGA) 10 MG TABS tablet    Sig: Take 1 tablet (10 mg total) by mouth in the morning.    Dispense:  90 tablet    Refill:  3     Follow-up: Return in about 3 months (around 10/13/2022).  Claretta Fraise, M.D.

## 2022-07-16 LAB — LIPID PANEL
Chol/HDL Ratio: 2.6 ratio (ref 0.0–5.0)
Cholesterol, Total: 126 mg/dL (ref 100–199)
HDL: 49 mg/dL (ref >39)
LDL Chol Calc (NIH): 59 mg/dL (ref 0–99)
Triglycerides: 93 mg/dL (ref 0–149)
VLDL Cholesterol Cal: 18 mg/dL (ref 5–40)

## 2022-07-16 LAB — CBC WITH DIFFERENTIAL/PLATELET
Basophils Absolute: 0.1 10*3/uL (ref 0.0–0.2)
Basos: 1 %
EOS (ABSOLUTE): 0.3 10*3/uL (ref 0.0–0.4)
Eos: 4 %
Hematocrit: 39.7 % (ref 37.5–51.0)
Hemoglobin: 13.5 g/dL (ref 13.0–17.7)
Immature Grans (Abs): 0 10*3/uL (ref 0.0–0.1)
Immature Granulocytes: 0 %
Lymphocytes Absolute: 1.4 10*3/uL (ref 0.7–3.1)
Lymphs: 20 %
MCH: 29.7 pg (ref 26.6–33.0)
MCHC: 34 g/dL (ref 31.5–35.7)
MCV: 87 fL (ref 79–97)
Monocytes Absolute: 0.6 10*3/uL (ref 0.1–0.9)
Monocytes: 8 %
Neutrophils Absolute: 4.7 10*3/uL (ref 1.4–7.0)
Neutrophils: 67 %
Platelets: 302 10*3/uL (ref 150–450)
RBC: 4.54 x10E6/uL (ref 4.14–5.80)
RDW: 12.7 % (ref 11.6–15.4)
WBC: 7 10*3/uL (ref 3.4–10.8)

## 2022-07-16 LAB — CMP14+EGFR
ALT: 20 IU/L (ref 0–44)
AST: 19 IU/L (ref 0–40)
Albumin/Globulin Ratio: 2.2 (ref 1.2–2.2)
Albumin: 4.8 g/dL (ref 3.9–4.9)
Alkaline Phosphatase: 59 IU/L (ref 44–121)
BUN/Creatinine Ratio: 14 (ref 10–24)
BUN: 14 mg/dL (ref 8–27)
Bilirubin Total: 0.5 mg/dL (ref 0.0–1.2)
CO2: 21 mmol/L (ref 20–29)
Calcium: 9.7 mg/dL (ref 8.6–10.2)
Chloride: 100 mmol/L (ref 96–106)
Creatinine, Ser: 1.01 mg/dL (ref 0.76–1.27)
Globulin, Total: 2.2 g/dL (ref 1.5–4.5)
Glucose: 165 mg/dL — ABNORMAL HIGH (ref 70–99)
Potassium: 5 mmol/L (ref 3.5–5.2)
Sodium: 136 mmol/L (ref 134–144)
Total Protein: 7 g/dL (ref 6.0–8.5)
eGFR: 84 mL/min/{1.73_m2} (ref >59)

## 2022-07-16 LAB — PSA, TOTAL AND FREE
PSA, Free Pct: 65 %
PSA, Free: 0.13 ng/mL
Prostate Specific Ag, Serum: 0.2 ng/mL (ref 0.0–4.0)

## 2022-07-16 LAB — MICROALBUMIN / CREATININE URINE RATIO
Creatinine, Urine: 18.4 mg/dL
Microalb/Creat Ratio: 30 mg/g creat — ABNORMAL HIGH (ref 0–29)
Microalbumin, Urine: 5.5 ug/mL

## 2022-07-16 NOTE — Progress Notes (Signed)
Hello Cory Morales,  Your lab result is normal and/or stable.Some minor variations that are not significant are commonly marked abnormal, but do not represent any medical problem for you.  Best regards, Claretta Fraise, M.D.

## 2022-07-27 ENCOUNTER — Ambulatory Visit: Payer: Commercial Managed Care - PPO | Admitting: Family Medicine

## 2022-09-17 ENCOUNTER — Telehealth: Payer: Self-pay

## 2022-09-17 NOTE — Telephone Encounter (Signed)
Farxiga  tablets   (Key: [id number] PA Case ID #: [id number] Rx #: [id number]  Sent to plan

## 2022-09-25 ENCOUNTER — Telehealth: Payer: Self-pay

## 2022-09-25 NOTE — Telephone Encounter (Signed)
Prior authorization was denied for generic but I believe they will cover brand name Marcelline Deist per letter below.  Dear Broadus John STACKS: We reviewed the information you provided in support of a request to obtain Dapagliflozin 10  mg TABLET under your patient's plan. We are unable to approve this request for the following  reason(s):  The patient is directed to [place] (brand). Coverage cannot be authorized at this time.  We based this decision on the prior authorization criteria for: 144641 dapagliflozin tablets -  [employer] Standard Non-Preferred Drug Coverage Review  We are providing you with a copy of the Notice of Adverse Benefit Determination that we sent  to your patient. The appeal rights and procedures are explained in the notice. If you have any questions or wish to discuss this determination with a pharmacist or  physician, please call us at [phone], 24 hours a day, 7 days a week. Sincerely, Coverage Review Department [facility]

## 2022-09-29 ENCOUNTER — Other Ambulatory Visit: Payer: Self-pay | Admitting: Family Medicine

## 2022-09-29 MED ORDER — JARDIANCE 25 MG PO TABS: 25.0000 mg | ORAL_TABLET | Freq: Every day | ORAL | 3 refills | Status: DC

## 2022-09-29 NOTE — Telephone Encounter (Signed)
Patient aware and verbalizes understanding. 

## 2022-09-29 NOTE — Telephone Encounter (Signed)
I sent a scrip for Jardiance as the computer generated information implies it will be easier to get through the prior authorization. It is very similar to the [place].

## 2022-10-06 ENCOUNTER — Other Ambulatory Visit (HOSPITAL_COMMUNITY): Payer: Self-pay

## 2022-10-06 ENCOUNTER — Telehealth: Payer: Self-pay

## 2022-10-06 NOTE — Telephone Encounter (Signed)
Prior authorization for Jardiance 25MG  tablets submitted and APPROVED. Test billing returns $149.99 copay for 90 day supply.  Key [id number] Effective: 09/06/22 - 10/06/23

## 2022-10-06 NOTE — Telephone Encounter (Signed)
Ran test claim for Jardiance. It also needed PA.  PA submitted and approved. Effective: 09/06/22 - 10/06/23

## 2022-10-13 ENCOUNTER — Ambulatory Visit: Payer: Commercial Managed Care - PPO | Admitting: Family Medicine

## 2022-11-10 ENCOUNTER — Ambulatory Visit: Payer: Commercial Managed Care - PPO | Admitting: Family Medicine

## 2022-12-09 ENCOUNTER — Ambulatory Visit: Payer: Commercial Managed Care - PPO | Admitting: Family Medicine

## 2023-01-13 ENCOUNTER — Ambulatory Visit: Payer: Commercial Managed Care - PPO | Admitting: Family Medicine

## 2023-01-19 ENCOUNTER — Ambulatory Visit: Payer: Commercial Managed Care - PPO | Admitting: Family Medicine

## 2023-01-25 ENCOUNTER — Ambulatory Visit (INDEPENDENT_AMBULATORY_CARE_PROVIDER_SITE_OTHER): Payer: Commercial Managed Care - PPO | Admitting: Family Medicine

## 2023-01-25 ENCOUNTER — Encounter: Payer: Self-pay | Admitting: Family Medicine

## 2023-01-25 VITALS — BP 118/71 | HR 78 | Temp 97.7°F | Ht 67.0 in | Wt 156.8 lb

## 2023-01-25 DIAGNOSIS — E119 Type 2 diabetes mellitus without complications: Secondary | ICD-10-CM

## 2023-01-25 DIAGNOSIS — R072 Precordial pain: Secondary | ICD-10-CM | POA: Diagnosis not present

## 2023-01-25 DIAGNOSIS — Z7984 Long term (current) use of oral hypoglycemic drugs: Secondary | ICD-10-CM | POA: Diagnosis not present

## 2023-01-25 DIAGNOSIS — I10 Essential (primary) hypertension: Secondary | ICD-10-CM

## 2023-01-25 DIAGNOSIS — Z13220 Encounter for screening for lipoid disorders: Secondary | ICD-10-CM

## 2023-01-25 DIAGNOSIS — K4090 Unilateral inguinal hernia, without obstruction or gangrene, not specified as recurrent: Secondary | ICD-10-CM

## 2023-01-25 LAB — CBC WITH DIFFERENTIAL/PLATELET
Basophils Absolute: 0.1 10*3/uL (ref 0.0–0.2)
Basos: 1 %
EOS (ABSOLUTE): 0.3 10*3/uL (ref 0.0–0.4)
Eos: 4 %
Hematocrit: 38.2 % (ref 37.5–51.0)
Hemoglobin: 12.7 g/dL — ABNORMAL LOW (ref 13.0–17.7)
Immature Grans (Abs): 0 10*3/uL (ref 0.0–0.1)
Immature Granulocytes: 0 %
Lymphocytes Absolute: 1.1 10*3/uL (ref 0.7–3.1)
Lymphs: 15 %
MCH: 28.1 pg (ref 26.6–33.0)
MCHC: 33.2 g/dL (ref 31.5–35.7)
MCV: 85 fL (ref 79–97)
Monocytes Absolute: 0.8 10*3/uL (ref 0.1–0.9)
Monocytes: 11 %
Neutrophils Absolute: 4.8 10*3/uL (ref 1.4–7.0)
Neutrophils: 69 %
Platelets: 361 10*3/uL (ref 150–450)
RBC: 4.52 x10E6/uL (ref 4.14–5.80)
RDW: 13.2 % (ref 11.6–15.4)
WBC: 7 10*3/uL (ref 3.4–10.8)

## 2023-01-25 LAB — LIPID PANEL
Chol/HDL Ratio: 2.5 ratio (ref 0.0–5.0)
Cholesterol, Total: 134 mg/dL (ref 100–199)
HDL: 53 mg/dL (ref >39)
LDL Chol Calc (NIH): 70 mg/dL (ref 0–99)
Triglycerides: 47 mg/dL (ref 0–149)
VLDL Cholesterol Cal: 11 mg/dL (ref 5–40)

## 2023-01-25 LAB — BAYER DCA HB A1C WAIVED: HB A1C (BAYER DCA - WAIVED): 7.7 % — ABNORMAL HIGH (ref 4.8–5.6)

## 2023-01-25 LAB — CMP14+EGFR
ALT: 10 IU/L (ref 0–44)
AST: 20 IU/L (ref 0–40)
Albumin: 4.3 g/dL (ref 3.9–4.9)
Alkaline Phosphatase: 65 IU/L (ref 44–121)
BUN/Creatinine Ratio: 18 (ref 10–24)
BUN: 20 mg/dL (ref 8–27)
Bilirubin Total: 0.7 mg/dL (ref 0.0–1.2)
CO2: 20 mmol/L (ref 20–29)
Calcium: 9.4 mg/dL (ref 8.6–10.2)
Chloride: 100 mmol/L (ref 96–106)
Creatinine, Ser: 1.11 mg/dL (ref 0.76–1.27)
Globulin, Total: 2.6 g/dL (ref 1.5–4.5)
Glucose: 127 mg/dL — ABNORMAL HIGH (ref 70–99)
Potassium: 4.8 mmol/L (ref 3.5–5.2)
Sodium: 137 mmol/L (ref 134–144)
Total Protein: 6.9 g/dL (ref 6.0–8.5)
eGFR: 75 mL/min/{1.73_m2} (ref >59)

## 2023-01-25 MED ORDER — ROSUVASTATIN CALCIUM 5 MG PO TABS: 5.0000 mg | ORAL_TABLET | Freq: Every day | ORAL | 3 refills | Status: DC

## 2023-01-25 MED ORDER — LISINOPRIL 10 MG PO TABS: 10.0000 mg | ORAL_TABLET | Freq: Every day | ORAL | 3 refills | Status: DC

## 2023-01-25 MED ORDER — SILDENAFIL CITRATE 20 MG PO TABS: ORAL_TABLET | ORAL | 3 refills | Status: DC

## 2023-01-25 MED ORDER — METFORMIN HCL ER 750 MG PO TB24: 750.0000 mg | ORAL_TABLET | Freq: Two times a day (BID) | ORAL | 3 refills | Status: DC

## 2023-01-25 NOTE — Progress Notes (Signed)
Subjective:  Patient ID: Cory Morales, male    DOB: 1961-02-16  Age: 62 y.o. MRN: [id number]  CC: Medical Management of Chronic Issues   HPI Cory Morales presents forFollow-up of diabetes. Patient checks blood sugar at home.   140s fasting and rarely checking postprandial Patient denies symptoms such as polyuria, polydipsia, excessive hunger, nausea No significant hypoglycemic spells noted. Medications reviewed. Pt reports taking them regularly without complication/adverse reaction being reported today.    in for follow-up of elevated cholesterol. Doing well without complaints on current medication. Denies side effects of statin including myalgia and arthralgia and nausea. Currently no chest pain, shortness of breath or other cardiovascular related symptoms noted.    History Cloyd has a past medical history of Diabetes mellitus without complication (HCC).   He has a past surgical history that includes Inguinal hernia repair (Right, 11/08/2017); Colonoscopy with propofol (N/A, 10/08/2020); and polypectomy (10/08/2020).   His family history includes COPD in his mother; Diabetes in his mother; Heart disease in his mother.He reports that he has never smoked. He has never used smokeless tobacco. He reports that he does not drink alcohol and does not use drugs.  Current Outpatient Medications on File Prior to Visit  Medication Sig Dispense Refill   acetaminophen (TYLENOL) 325 MG tablet Take 650 mg by mouth every 6 (six) hours as needed (pain).     ibuprofen (ADVIL,MOTRIN) 200 MG tablet Take 200 mg by mouth every 8 (eight) hours as needed (pain).     JARDIANCE 25 MG TABS tablet Take 1 tablet (25 mg total) by mouth daily. 90 tablet 3   Lancets Misc. MISC Use to check blood sugars 100 each 3   No current facility-administered medications on file prior to visit.    ROS Review of Systems  Constitutional:  Negative for fever.  Respiratory:  Negative for shortness of breath.    Cardiovascular:  Positive for chest pain (1 episode 2 weeks ago lasting 3-4 hours. precordial. Radiated to right side. Hard).  Musculoskeletal:  Negative for arthralgias.  Skin:  Negative for rash.    Objective:  BP 118/71   Pulse 78   Temp 97.7 F (36.5 C)   Ht 5\' 7"  (1.702 m)   Wt 156 lb 12.8 oz (71.1 kg)   SpO2 95%   BMI 24.56 kg/m   BP Readings from Last 3 Encounters:  01/25/23 118/71  07/15/22 118/70  01/08/22 117/66    Wt Readings from Last 3 Encounters:  01/25/23 156 lb 12.8 oz (71.1 kg)  07/15/22 164 lb 3.2 oz (74.5 kg)  01/08/22 160 lb (72.6 kg)     Physical Exam Vitals reviewed.  Constitutional:      Appearance: He is well-developed.  HENT:     Head: Normocephalic and atraumatic.     Right Ear: External ear normal.     Left Ear: External ear normal.     Mouth/Throat:     Pharynx: No oropharyngeal exudate or posterior oropharyngeal erythema.  Eyes:     Pupils: Pupils are equal, round, and reactive to light.  Cardiovascular:     Rate and Rhythm: Normal rate and regular rhythm.     Heart sounds: No murmur heard. Pulmonary:     Effort: No respiratory distress.     Breath sounds: Normal breath sounds.  Abdominal:     Hernia: A hernia (LIH) is present.  Musculoskeletal:     Cervical back: Normal range of motion and neck supple.  Neurological:  Mental Status: He is alert and oriented to person, place, and time.    Diabetic Foot Exam - Simple   Simple Foot Form Diabetic Foot exam was performed with the following findings: Yes 01/25/2023 10:23 AM  Visual Inspection No deformities, no ulcerations, no other skin breakdown bilaterally: Yes Sensation Testing Intact to touch and monofilament testing bilaterally: Yes Pulse Check Posterior Tibialis and Dorsalis pulse intact bilaterally: Yes Comments   EKG: NSR. No ischemic changes. Nml segments and intervals     Assessment & Plan:   Julious was seen today for medical management of chronic  issues.  Diagnoses and all orders for this visit:  Type 2 diabetes mellitus without complication, without long-term current use of insulin (HCC) -     Bayer DCA Hb A1c Waived  Hypertension, unspecified type -     CBC with Differential/Platelet -     CMP14+EGFR  Lipid screening -     Lipid panel  Newly diagnosed diabetes (HCC) -     lisinopril (ZESTRIL) 10 MG tablet; Take 1 tablet (10 mg total) by mouth daily. -     metFORMIN (GLUCOPHAGE-XR) 750 MG 24 hr tablet; Take 1 tablet (750 mg total) by mouth in the morning and at bedtime.  Precordial pain -     EKG 12-Lead  Left inguinal hernia -     Ambulatory referral to General Surgery  Other orders -     rosuvastatin (CRESTOR) 5 MG tablet; Take 1 tablet (5 mg total) by mouth daily. For cholesterol -     sildenafil (REVATIO) 20 MG tablet; Take 2-5 pills at once, orally, with each sexual encounter      I am having Dorene Sorrow L. Nakajima maintain his acetaminophen, ibuprofen, Lancets Misc., Jardiance, lisinopril, metFORMIN, rosuvastatin, and sildenafil.  Meds ordered this encounter  Medications   lisinopril (ZESTRIL) 10 MG tablet    Sig: Take 1 tablet (10 mg total) by mouth daily.    Dispense:  90 tablet    Refill:  3   metFORMIN (GLUCOPHAGE-XR) 750 MG 24 hr tablet    Sig: Take 1 tablet (750 mg total) by mouth in the morning and at bedtime.    Dispense:  180 tablet    Refill:  3   rosuvastatin (CRESTOR) 5 MG tablet    Sig: Take 1 tablet (5 mg total) by mouth daily. For cholesterol    Dispense:  90 tablet    Refill:  3   sildenafil (REVATIO) 20 MG tablet    Sig: Take 2-5 pills at once, orally, with each sexual encounter    Dispense:  100 tablet    Refill:  3     Follow-up: Return in about 3 months (around 04/27/2023) for diabetes.  Mechele Claude, M.D.

## 2023-01-26 NOTE — Progress Notes (Signed)
Hello Airik,  Your lab result is normal and/or stable.Some minor variations that are not significant are commonly marked abnormal, but do not represent any medical problem for you.  Best regards, Warren Stacks, M.D.

## 2023-02-18 ENCOUNTER — Telehealth: Payer: Self-pay | Admitting: Family Medicine

## 2023-02-23 ENCOUNTER — Telehealth: Payer: Self-pay

## 2023-02-23 ENCOUNTER — Other Ambulatory Visit (HOSPITAL_COMMUNITY): Payer: Self-pay

## 2023-02-23 NOTE — Telephone Encounter (Signed)
Pharmacy Patient Advocate Encounter   Received notification from Pt Calls Messages that prior authorization for Sildenafil Citrate 20MG  tablets is required/requested.   Insurance verification completed.   The patient is insured through [employer] .   Per test claim: PA required; PA submitted to [facility] via Fax Key/confirmation #/EOC --- Status is pending    For follow-up: [phone] phone [phone] fax

## 2023-02-23 NOTE — Telephone Encounter (Signed)
PA request has been Submitted. New Encounter created for follow up. For additional info see Pharmacy Prior Auth telephone encounter from 02/23/23.

## 2023-02-24 NOTE — Telephone Encounter (Signed)
Received fax stating the drug and diagnosis code is not reviewed by [facility] and to call [facility] for further assistance. Called [facility] and they do not handle PA for patient's plan. Representative transferred me to [facility]. Per representative, Sildenafil is excluded from coverage and they do not do PA unless it is for the heart.   [facility] phone number: [phone]

## 2023-02-25 ENCOUNTER — Ambulatory Visit: Payer: Commercial Managed Care - PPO | Admitting: General Surgery

## 2023-03-01 NOTE — Telephone Encounter (Signed)
PATIENT AWARE

## 2023-03-01 NOTE — Telephone Encounter (Signed)
Let pt. Know his insurance doesn't cover this med, a very common thing. He can pay cash it if he still wants it.

## 2023-04-27 ENCOUNTER — Ambulatory Visit: Payer: Commercial Managed Care - PPO | Admitting: Family Medicine

## 2023-06-15 ENCOUNTER — Ambulatory Visit: Payer: Commercial Managed Care - PPO | Admitting: Family Medicine

## 2023-08-05 LAB — HM DIABETES EYE EXAM

## 2023-08-11 ENCOUNTER — Encounter: Payer: Self-pay | Admitting: Family Medicine

## 2023-08-11 ENCOUNTER — Ambulatory Visit (INDEPENDENT_AMBULATORY_CARE_PROVIDER_SITE_OTHER): Admitting: Family Medicine

## 2023-08-11 VITALS — BP 113/70 | HR 67 | Temp 97.0°F | Ht 67.0 in | Wt 155.0 lb

## 2023-08-11 DIAGNOSIS — I10 Essential (primary) hypertension: Secondary | ICD-10-CM

## 2023-08-11 DIAGNOSIS — Z7984 Long term (current) use of oral hypoglycemic drugs: Secondary | ICD-10-CM

## 2023-08-11 DIAGNOSIS — E119 Type 2 diabetes mellitus without complications: Secondary | ICD-10-CM | POA: Diagnosis not present

## 2023-08-11 DIAGNOSIS — Z13220 Encounter for screening for lipoid disorders: Secondary | ICD-10-CM | POA: Diagnosis not present

## 2023-08-11 LAB — BAYER DCA HB A1C WAIVED: HB A1C (BAYER DCA - WAIVED): 7.9 % — ABNORMAL HIGH (ref 4.8–5.6)

## 2023-08-11 LAB — LIPID PANEL

## 2023-08-11 NOTE — Progress Notes (Signed)
 Subjective:  Patient ID: Cory Morales,  male    DOB: 06/01/1961  Age: 63 y.o.    CC: Diabetes (No concerns)   HPI Cory Morales presents for  follow-up of hypertension. Patient has no history of headache chest pain or shortness of breath or recent cough. Patient also denies symptoms of TIA such as numbness weakness lateralizing. Patient denies side effects from medication. States taking it regularly.  Patient also  in for follow-up of elevated cholesterol. Doing well without complaints on current medication. Denies side effects  including myalgia and arthralgia and nausea. Also in today for liver function testing. Currently no chest pain, shortness of breath or other cardiovascular related symptoms noted.  Follow-up of diabetes. Patient does check blood sugar at home. Readings run between 140 and 150s Patient denies symptoms such as excessive hunger or urinary frequency, excessive hunger, nausea No significant hypoglycemic spells noted. Medications reviewed. Pt reports taking them regularly. Pt. denies complication/adverse reaction today.    History Cory Morales has a past medical history of Diabetes mellitus without complication (HCC).   He has a past surgical history that includes Inguinal hernia repair (Right, 11/08/2017); Colonoscopy with propofol (N/A, 10/08/2020); and polypectomy (10/08/2020).   His family history includes COPD in his mother; Diabetes in his mother; Heart disease in his mother.He reports that he has never smoked. He has never used smokeless tobacco. He reports that he does not drink alcohol and does not use drugs.  Current Outpatient Medications on File Prior to Visit  Medication Sig Dispense Refill   acetaminophen (TYLENOL) 325 MG tablet Take 650 mg by mouth every 6 (six) hours as needed (pain).     JARDIANCE 25 MG TABS tablet Take 1 tablet (25 mg total) by mouth daily. 90 tablet 3   Lancets Misc. MISC Use to check blood sugars 100 each 3   lisinopril (ZESTRIL)  10 MG tablet Take 1 tablet (10 mg total) by mouth daily. 90 tablet 3   metFORMIN (GLUCOPHAGE-XR) 750 MG 24 hr tablet Take 1 tablet (750 mg total) by mouth in the morning and at bedtime. 180 tablet 3   rosuvastatin (CRESTOR) 5 MG tablet Take 1 tablet (5 mg total) by mouth daily. For cholesterol 90 tablet 3   sildenafil (REVATIO) 20 MG tablet Take 2-5 pills at once, orally, with each sexual encounter 100 tablet 3   No current facility-administered medications on file prior to visit.    ROS Review of Systems  Constitutional:  Negative for fever.  Respiratory:  Negative for shortness of breath.   Cardiovascular:  Negative for chest pain.  Musculoskeletal:  Negative for arthralgias.  Skin:  Negative for rash.  Allergic/Immunologic: Negative.   Neurological: Negative.   Psychiatric/Behavioral: Negative.      Objective:  BP 113/70   Pulse 67   Temp (!) 97 F (36.1 C)   Ht 5\' 7"  (1.702 m)   Wt 155 lb (70.3 kg)   SpO2 98%   BMI 24.28 kg/m   BP Readings from Last 3 Encounters:  08/11/23 113/70  01/25/23 118/71  07/15/22 118/70    Wt Readings from Last 3 Encounters:  08/11/23 155 lb (70.3 kg)  01/25/23 156 lb 12.8 oz (71.1 kg)  07/15/22 164 lb 3.2 oz (74.5 kg)     Physical Exam Vitals reviewed.  Constitutional:      Appearance: He is well-developed.  HENT:     Head: Normocephalic and atraumatic.     Right Ear: External ear normal.  Left Ear: External ear normal.     Mouth/Throat:     Pharynx: No oropharyngeal exudate or posterior oropharyngeal erythema.  Eyes:     Pupils: Pupils are equal, round, and reactive to light.  Cardiovascular:     Rate and Rhythm: Normal rate and regular rhythm.     Heart sounds: No murmur heard. Pulmonary:     Effort: No respiratory distress.     Breath sounds: Normal breath sounds.  Musculoskeletal:     Cervical back: Normal range of motion and neck supple.  Neurological:     Mental Status: He is alert and oriented to person, place,  and time.        Lab Results  Component Value Date   HGBA1C 7.9 (H) 08/11/2023   HGBA1C 7.7 (H) 01/25/2023   HGBA1C 9.3 (H) 07/15/2022    Assessment & Plan:   Blayden was seen today for diabetes.  Diagnoses and all orders for this visit:  Type 2 diabetes mellitus without complication, without long-term current use of insulin (HCC) -     Bayer DCA Hb A1c Waived  Hypertension, unspecified type -     CBC with Differential/Platelet -     CMP14+EGFR  Lipid screening -     Lipid panel   I have discontinued Dorene Sorrow L. Demars's ibuprofen. I am also having him maintain his acetaminophen, Lancets Misc., Jardiance, lisinopril, metFORMIN, rosuvastatin, and sildenafil.    Follow-up: Return in about 3 months (around 11/11/2023).  Mechele Claude, M.D.

## 2023-08-12 LAB — LIPID PANEL
Cholesterol, Total: 132 mg/dL (ref 100–199)
HDL: 45 mg/dL (ref >39)
LDL CALC COMMENT:: 2.9 ratio (ref 0.0–5.0)
LDL Chol Calc (NIH): 73 mg/dL (ref 0–99)
Triglycerides: 70 mg/dL (ref 0–149)
VLDL Cholesterol Cal: 14 mg/dL (ref 5–40)

## 2023-08-12 LAB — CBC WITH DIFFERENTIAL/PLATELET
Basophils Absolute: 0.1 10*3/uL (ref 0.0–0.2)
Basos: 1 %
EOS (ABSOLUTE): 0.5 10*3/uL — ABNORMAL HIGH (ref 0.0–0.4)
Eos: 7 %
Hematocrit: 39.9 % (ref 37.5–51.0)
Hemoglobin: 12.9 g/dL — ABNORMAL LOW (ref 13.0–17.7)
Immature Grans (Abs): 0 10*3/uL (ref 0.0–0.1)
Immature Granulocytes: 1 %
Lymphocytes Absolute: 0.7 10*3/uL (ref 0.7–3.1)
Lymphs: 11 %
MCH: 27.9 pg (ref 26.6–33.0)
MCHC: 32.3 g/dL (ref 31.5–35.7)
MCV: 86 fL (ref 79–97)
Monocytes Absolute: 0.5 10*3/uL (ref 0.1–0.9)
Monocytes: 8 %
Neutrophils Absolute: 4.6 10*3/uL (ref 1.4–7.0)
Neutrophils: 72 %
Platelets: 362 10*3/uL (ref 150–450)
RBC: 4.62 x10E6/uL (ref 4.14–5.80)
RDW: 13 % (ref 11.6–15.4)
WBC: 6.4 10*3/uL (ref 3.4–10.8)

## 2023-08-12 LAB — CMP14+EGFR
ALT: 11 IU/L (ref 0–44)
AST: 16 IU/L (ref 0–40)
Albumin: 4.1 g/dL (ref 3.9–4.9)
Alkaline Phosphatase: 77 IU/L (ref 44–121)
BUN/Creatinine Ratio: 19 (ref 10–24)
BUN: 20 mg/dL (ref 8–27)
Bilirubin Total: 0.3 mg/dL (ref 0.0–1.2)
CO2: 21 mmol/L (ref 20–29)
Calcium: 9.3 mg/dL (ref 8.6–10.2)
Chloride: 99 mmol/L (ref 96–106)
Creatinine, Ser: 1.08 mg/dL (ref 0.76–1.27)
Globulin, Total: 2.7 g/dL (ref 1.5–4.5)
Glucose: 144 mg/dL — ABNORMAL HIGH (ref 70–99)
Potassium: 5.1 mmol/L (ref 3.5–5.2)
Sodium: 134 mmol/L (ref 134–144)
Total Protein: 6.8 g/dL (ref 6.0–8.5)
eGFR: 77 mL/min/{1.73_m2} (ref >59)

## 2023-08-14 ENCOUNTER — Encounter: Payer: Self-pay | Admitting: Family Medicine

## 2023-08-14 NOTE — Progress Notes (Signed)
 Hello [name],  Your lab result is normal and/or stable.Some minor variations that are not significant are commonly marked abnormal, but do not represent any medical problem for you.  Best regards, Mechele Claude, M.D.

## 2023-09-22 ENCOUNTER — Other Ambulatory Visit (HOSPITAL_COMMUNITY): Payer: Self-pay

## 2023-09-22 ENCOUNTER — Telehealth: Payer: Self-pay

## 2023-09-22 NOTE — Telephone Encounter (Signed)
 Pharmacy Patient Advocate Encounter   Received notification from CoverMyMeds that prior authorization for Jardiance  25MG  tablets is due for renewal.   Insurance verification completed.   The patient is insured through [employer].  Action: PA required; PA started via CoverMyMeds. KEY [id number] . Waiting for clinical questions to populate.

## 2023-09-22 NOTE — Telephone Encounter (Signed)
 ERROR

## 2023-09-23 ENCOUNTER — Other Ambulatory Visit (HOSPITAL_COMMUNITY): Payer: Self-pay

## 2023-09-23 NOTE — Telephone Encounter (Signed)
 Pharmacy Patient Advocate Encounter  Received notification from [facility] that Prior Authorization for Jardiance  25MG  tablets has been APPROVED Unable to obtain price due to refill too soon rejection, last fill date 08/10/2023 next available fill date 11/03/2023   (Key: [id number])

## 2023-11-08 ENCOUNTER — Other Ambulatory Visit: Payer: Self-pay | Admitting: Family Medicine

## 2023-11-11 ENCOUNTER — Ambulatory Visit: Admitting: Family Medicine

## 2023-12-20 ENCOUNTER — Encounter: Payer: Self-pay | Admitting: Family Medicine

## 2023-12-20 ENCOUNTER — Ambulatory Visit: Payer: Self-pay | Admitting: Family Medicine

## 2023-12-20 ENCOUNTER — Ambulatory Visit: Admitting: Family Medicine

## 2023-12-20 VITALS — BP 109/69 | HR 75 | Temp 98.0°F | Ht 67.0 in | Wt 151.0 lb

## 2023-12-20 DIAGNOSIS — E782 Mixed hyperlipidemia: Secondary | ICD-10-CM

## 2023-12-20 DIAGNOSIS — I10 Essential (primary) hypertension: Secondary | ICD-10-CM

## 2023-12-20 DIAGNOSIS — E119 Type 2 diabetes mellitus without complications: Secondary | ICD-10-CM | POA: Diagnosis not present

## 2023-12-20 LAB — CMP14+EGFR
ALT: 11 IU/L (ref 0–44)
AST: 12 IU/L (ref 0–40)
Albumin: 4.5 g/dL (ref 3.9–4.9)
Alkaline Phosphatase: 64 IU/L (ref 44–121)
BUN/Creatinine Ratio: 9 — ABNORMAL LOW (ref 10–24)
BUN: 9 mg/dL (ref 8–27)
Bilirubin Total: 0.4 mg/dL (ref 0.0–1.2)
CO2: 20 mmol/L (ref 20–29)
Calcium: 9.7 mg/dL (ref 8.6–10.2)
Chloride: 100 mmol/L (ref 96–106)
Creatinine, Ser: 1.03 mg/dL (ref 0.76–1.27)
Globulin, Total: 2.3 g/dL (ref 1.5–4.5)
Glucose: 138 mg/dL — ABNORMAL HIGH (ref 70–99)
Potassium: 4.8 mmol/L (ref 3.5–5.2)
Sodium: 137 mmol/L (ref 134–144)
Total Protein: 6.8 g/dL (ref 6.0–8.5)
eGFR: 81 mL/min/1.73 (ref >59)

## 2023-12-20 LAB — CBC WITH DIFFERENTIAL/PLATELET
Basophils Absolute: 0.1 x10E3/uL (ref 0.0–0.2)
Basos: 1 %
EOS (ABSOLUTE): 0.2 x10E3/uL (ref 0.0–0.4)
Eos: 3 %
Hematocrit: 44.9 % (ref 37.5–51.0)
Hemoglobin: 14.6 g/dL (ref 13.0–17.7)
Immature Grans (Abs): 0 x10E3/uL (ref 0.0–0.1)
Immature Granulocytes: 0 %
Lymphocytes Absolute: 0.9 x10E3/uL (ref 0.7–3.1)
Lymphs: 12 %
MCH: 28.6 pg (ref 26.6–33.0)
MCHC: 32.5 g/dL (ref 31.5–35.7)
MCV: 88 fL (ref 79–97)
Monocytes Absolute: 0.5 x10E3/uL (ref 0.1–0.9)
Monocytes: 7 %
Neutrophils Absolute: 5.5 x10E3/uL (ref 1.4–7.0)
Neutrophils: 77 %
Platelets: 322 x10E3/uL (ref 150–450)
RBC: 5.1 x10E6/uL (ref 4.14–5.80)
RDW: 12.7 % (ref 11.6–15.4)
WBC: 7.1 x10E3/uL (ref 3.4–10.8)

## 2023-12-20 LAB — LIPID PANEL
Chol/HDL Ratio: 2.4 ratio (ref 0.0–5.0)
Cholesterol, Total: 137 mg/dL (ref 100–199)
HDL: 56 mg/dL (ref >39)
LDL Chol Calc (NIH): 64 mg/dL (ref 0–99)
Triglycerides: 89 mg/dL (ref 0–149)
VLDL Cholesterol Cal: 17 mg/dL (ref 5–40)

## 2023-12-20 LAB — BAYER DCA HB A1C WAIVED: HB A1C (BAYER DCA - WAIVED): 7.8 % — ABNORMAL HIGH (ref 4.8–5.6)

## 2023-12-20 MED ORDER — LISINOPRIL 10 MG PO TABS
10.0000 mg | ORAL_TABLET | Freq: Every day | ORAL | 3 refills | Status: AC
Start: 2023-12-20 — End: ?

## 2023-12-20 MED ORDER — SILDENAFIL CITRATE 20 MG PO TABS: ORAL_TABLET | ORAL | 3 refills | Status: AC

## 2023-12-20 MED ORDER — ROSUVASTATIN CALCIUM 5 MG PO TABS: 5.0000 mg | ORAL_TABLET | Freq: Every day | ORAL | 3 refills | Status: AC

## 2023-12-20 MED ORDER — JARDIANCE 25 MG PO TABS: 25.0000 mg | ORAL_TABLET | Freq: Every day | ORAL | 0 refills | Status: DC

## 2023-12-20 MED ORDER — METFORMIN HCL ER 750 MG PO TB24: 750.0000 mg | ORAL_TABLET | Freq: Two times a day (BID) | ORAL | 3 refills | Status: AC

## 2023-12-20 NOTE — Addendum Note (Signed)
 Addended by: SANDA REA SQUIBB on: 12/20/2023 11:29 AM   Modules accepted: Orders

## 2023-12-20 NOTE — Progress Notes (Signed)
 Subjective:  Patient ID: Cory Morales,  male    DOB: 01/18/1961  Age: 63 y.o.    CC: Medical Management of Chronic Issues (No concerns at this time. )   HPI Cory Morales presents for  follow-up of hypertension. Patient has no history of headache chest pain or shortness of breath or recent cough. Patient also denies symptoms of TIA such as numbness weakness lateralizing. Patient denies side effects from medication. States taking it regularly.  Patient also  in for follow-up of elevated cholesterol. Doing well without complaints on current medication. Denies side effects  including myalgia and arthralgia and nausea. Also in today for liver function testing. Currently no chest pain, shortness of breath or other cardiovascular related symptoms noted.  Follow-up of diabetes. Patient does check blood sugar at home. Readings run between 150 and 155 Patient denies symptoms such as excessive hunger or urinary frequency, excessive hunger, nausea No significant hypoglycemic spells noted. Medications reviewed. Pt reports taking them regularly. Pt. denies complication/adverse reaction today.    History Cory Morales has a past medical history of Diabetes mellitus without complication (HCC).   He has a past surgical history that includes Inguinal hernia repair (Right, 11/08/2017); Colonoscopy with propofol  (N/A, 10/08/2020); and polypectomy (10/08/2020).   His family history includes COPD in his mother; Diabetes in his mother; Heart disease in his mother.He reports that he has never smoked. He has never used smokeless tobacco. He reports that he does not drink alcohol and does not use drugs.  Current Outpatient Medications on File Prior to Visit  Medication Sig Dispense Refill   acetaminophen  (TYLENOL ) 325 MG tablet Take 650 mg by mouth every 6 (six) hours as needed (pain).     Lancets Misc. MISC Use to check blood sugars 100 each 3   No current facility-administered medications on file prior to  visit.    ROS Review of Systems  Constitutional:  Negative for fever.  Respiratory:  Negative for shortness of breath.   Cardiovascular:  Negative for chest pain.  Musculoskeletal:  Negative for arthralgias.  Skin:  Negative for rash.    Objective:  BP 109/69   Pulse 75   Temp 98 F (36.7 C)   Ht 5' 7 (1.702 m)   Wt 151 lb (68.5 kg)   SpO2 98%   BMI 23.65 kg/m   BP Readings from Last 3 Encounters:  12/20/23 109/69  08/11/23 113/70  01/25/23 118/71    Wt Readings from Last 3 Encounters:  12/20/23 151 lb (68.5 kg)  08/11/23 155 lb (70.3 kg)  01/25/23 156 lb 12.8 oz (71.1 kg)    Lab Results  Component Value Date   HGBA1C 7.9 (H) 08/11/2023   HGBA1C 7.7 (H) 01/25/2023   HGBA1C 9.3 (H) 07/15/2022    Physical Exam Vitals reviewed.  Constitutional:      Appearance: He is well-developed.  HENT:     Head: Normocephalic and atraumatic.     Right Ear: External ear normal.     Left Ear: External ear normal.     Mouth/Throat:     Pharynx: No oropharyngeal exudate or posterior oropharyngeal erythema.  Eyes:     Pupils: Pupils are equal, round, and reactive to light.  Cardiovascular:     Rate and Rhythm: Normal rate and regular rhythm.     Heart sounds: No murmur heard. Pulmonary:     Effort: No respiratory distress.     Breath sounds: Normal breath sounds.  Musculoskeletal:     Cervical  back: Normal range of motion and neck supple.  Neurological:     Mental Status: He is alert and oriented to person, place, and time.         Assessment & Plan:  Type 2 diabetes mellitus without complication, without long-term current use of insulin (HCC) -     Bayer DCA Hb A1c Waived -     CMP14+EGFR -     Lisinopril ; Take 1 tablet (10 mg total) by mouth daily.  Dispense: 90 tablet; Refill: 3 -     metFORMIN  HCl ER; Take 1 tablet (750 mg total) by mouth in the morning and at bedtime.  Dispense: 180 tablet; Refill: 3 -     Microalbumin / creatinine urine ratio -     Lipid  panel  Hypertension, unspecified type -     CBC with Differential/Platelet  Mixed hyperlipidemia -     Lipid panel  Other orders -     Rosuvastatin  Calcium ; Take 1 tablet (5 mg total) by mouth daily. For cholesterol  Dispense: 90 tablet; Refill: 3 -     Jardiance ; Take 1 tablet (25 mg total) by mouth daily.  Dispense: 90 tablet; Refill: 0 -     Sildenafil  Citrate; Take 2-5 pills at once, orally, with each sexual encounter  Dispense: 100 tablet; Refill: 3    Follow-up: Return in about 3 months (around 03/21/2024).  Butler Der, M.D.

## 2023-12-21 LAB — MICROALBUMIN / CREATININE URINE RATIO
Creatinine, Urine: 95.7 mg/dL
Microalb/Creat Ratio: <3 mg/g{creat} (ref 0–29)
Microalbumin, Urine: <3 ug/mL

## 2023-12-21 NOTE — Progress Notes (Signed)
 Hello [name],  Your lab result is normal and/or stable.Some minor variations that are not significant are commonly marked abnormal, but do not represent any medical problem for you.  Best regards, Mechele Claude, M.D.

## 2024-03-21 ENCOUNTER — Ambulatory Visit: Admitting: Family Medicine

## 2024-03-24 ENCOUNTER — Encounter: Payer: Self-pay | Admitting: *Deleted

## 2024-05-06 ENCOUNTER — Other Ambulatory Visit: Payer: Self-pay | Admitting: Family Medicine

## 2024-06-15 ENCOUNTER — Ambulatory Visit: Admitting: Family Medicine

## 2024-06-26 ENCOUNTER — Ambulatory Visit: Payer: Self-pay | Admitting: Family Medicine

## 2024-06-28 ENCOUNTER — Ambulatory Visit: Admitting: Family Medicine

## 2024-06-29 ENCOUNTER — Other Ambulatory Visit: Payer: Self-pay | Admitting: Family Medicine

## 2024-06-29 ENCOUNTER — Ambulatory Visit: Admitting: Family Medicine

## 2024-06-29 ENCOUNTER — Ambulatory Visit

## 2024-06-29 ENCOUNTER — Encounter: Payer: Self-pay | Admitting: Family Medicine

## 2024-06-29 VITALS — BP 115/69 | HR 82 | Temp 97.5°F | Ht 67.0 in | Wt 158.0 lb

## 2024-06-29 DIAGNOSIS — E119 Type 2 diabetes mellitus without complications: Secondary | ICD-10-CM | POA: Diagnosis not present

## 2024-06-29 DIAGNOSIS — E782 Mixed hyperlipidemia: Secondary | ICD-10-CM

## 2024-06-29 DIAGNOSIS — Z7984 Long term (current) use of oral hypoglycemic drugs: Secondary | ICD-10-CM

## 2024-06-29 DIAGNOSIS — I10 Essential (primary) hypertension: Secondary | ICD-10-CM

## 2024-06-29 LAB — CBC WITH DIFFERENTIAL/PLATELET
Basophils Absolute: 0.1 10*3/uL (ref 0.0–0.2)
Basos: 1 %
EOS (ABSOLUTE): 0.2 10*3/uL (ref 0.0–0.4)
Eos: 3 %
Hematocrit: 45.5 % (ref 37.5–51.0)
Hemoglobin: 14.8 g/dL (ref 13.0–17.7)
Immature Grans (Abs): 0 10*3/uL (ref 0.0–0.1)
Immature Granulocytes: 0 %
Lymphocytes Absolute: 0.8 10*3/uL (ref 0.7–3.1)
Lymphs: 12 %
MCH: 29.1 pg (ref 26.6–33.0)
MCHC: 32.5 g/dL (ref 31.5–35.7)
MCV: 90 fL (ref 79–97)
Monocytes Absolute: 0.4 10*3/uL (ref 0.1–0.9)
Monocytes: 7 %
Neutrophils Absolute: 4.7 10*3/uL (ref 1.4–7.0)
Neutrophils: 77 %
Platelets: 299 10*3/uL (ref 150–450)
RBC: 5.08 x10E6/uL (ref 4.14–5.80)
RDW: 12.3 % (ref 11.6–15.4)
WBC: 6.1 10*3/uL (ref 3.4–10.8)

## 2024-06-29 LAB — COMPREHENSIVE METABOLIC PANEL WITH GFR
ALT: 13 [IU]/L (ref 0–44)
AST: 18 [IU]/L (ref 0–40)
Albumin: 4.7 g/dL (ref 3.9–4.9)
Alkaline Phosphatase: 61 [IU]/L (ref 47–123)
BUN/Creatinine Ratio: 19 (ref 10–24)
BUN: 19 mg/dL (ref 8–27)
Bilirubin Total: 0.5 mg/dL (ref 0.0–1.2)
CO2: 22 mmol/L (ref 20–29)
Calcium: 9.3 mg/dL (ref 8.6–10.2)
Chloride: 100 mmol/L (ref 96–106)
Creatinine, Ser: 1 mg/dL (ref 0.76–1.27)
Globulin, Total: 2.3 g/dL (ref 1.5–4.5)
Glucose: 123 mg/dL — ABNORMAL HIGH (ref 70–99)
Potassium: 5 mmol/L (ref 3.5–5.2)
Sodium: 138 mmol/L (ref 134–144)
Total Protein: 7 g/dL (ref 6.0–8.5)
eGFR: 84 mL/min/{1.73_m2}

## 2024-06-29 LAB — BAYER DCA HB A1C WAIVED: HB A1C (BAYER DCA - WAIVED): 8.7 % — ABNORMAL HIGH (ref 4.8–5.6)

## 2024-06-29 MED ORDER — SITAGLIPTIN PHOSPHATE 100 MG PO TABS: 100.0000 mg | ORAL_TABLET | Freq: Every day | ORAL | 1 refills | Status: AC

## 2024-06-29 NOTE — Progress Notes (Unsigned)
" ° °  Subjective:  Patient ID: Cory Morales, male    DOB: 1960/10/15  Age: 64 y.o. MRN: [id number]  CC: No chief complaint on file.   HPI  Discussed the use of AI scribe software for clinical note transcription with the patient, who gave verbal consent to proceed.  History of Present Illness           06/29/2024    9:03 AM 08/11/2023    8:02 AM 01/25/2023    9:46 AM  Depression screen PHQ 2/9  Decreased Interest 0 0 0  Down, Depressed, Hopeless 0 0 0  PHQ - 2 Score 0 0 0  Altered sleeping 0 0   Tired, decreased energy 0 0   Change in appetite 0 0   Feeling bad or failure about yourself  0 0   Trouble concentrating 0 0   Moving slowly or fidgety/restless 0 0   Suicidal thoughts 0 0   PHQ-9 Score 0 0    Difficult doing work/chores Not difficult at all Not difficult at all      Data saved with a previous flowsheet row definition    History Martha has a past medical history of Diabetes mellitus without complication (HCC).   He has a past surgical history that includes Inguinal hernia repair (Right, 11/08/2017); Colonoscopy with propofol  (N/A, 10/08/2020); and polypectomy (10/08/2020).   His family history includes COPD in his mother; Diabetes in his mother; Heart disease in his mother.He reports that he has never smoked. He has never used smokeless tobacco. He reports that he does not drink alcohol and does not use drugs.    ROS Review of Systems  Objective:  There were no vitals taken for this visit.  BP Readings from Last 3 Encounters:  06/29/24 115/69  12/20/23 109/69  08/11/23 113/70    Wt Readings from Last 3 Encounters:  06/29/24 158 lb (71.7 kg)  12/20/23 151 lb (68.5 kg)  08/11/23 155 lb (70.3 kg)     Physical Exam Physical Exam    Assessment & Plan:  There are no diagnoses linked to this encounter.  Assessment and Plan Assessment & Plan        Follow-up: No follow-ups on file.  Butler Der, M.D. "

## 2024-06-29 NOTE — Progress Notes (Signed)
 "  Subjective:  Patient ID: Cory Morales, male    DOB: [birth year]  Age: 64 y.o. MRN: [id number]  CC: Medication Refill (pended)   HPI  Discussed the use of AI scribe software for clinical note transcription with the patient, who gave verbal consent to proceed.  History of Present Illness Cory Morales is a 64 year old male with diabetes who presents for a regular diabetes checkup.  He has noticed slightly elevated blood sugar levels, with morning readings around 150 to 160 mg/dL and later in the day around 130 mg/dL. He reports that he eats late at night and has noticed these fluctuations in his blood sugar. He is currently taking Jardiance  and metformin , with metformin  taken every morning and after school. His last A1c was 7%, but he does not recall the exact number from the most recent test.  He is also taking rosuvastatin  (Crestor ) daily for cholesterol management. He mentions experiencing some soreness during physical activities, describing it as mild discomfort rather than pain, occurring during activities such as lifting or working around the house. The soreness is described as mild discomfort rather than pain, occurring during activities such as lifting or working around the house.          06/29/2024    9:03 AM 08/11/2023    8:02 AM 01/25/2023    9:46 AM  Depression screen PHQ 2/9  Decreased Interest 0 0 0  Down, Depressed, Hopeless 0 0 0  PHQ - 2 Score 0 0 0  Altered sleeping 0 0   Tired, decreased energy 0 0   Change in appetite 0 0   Feeling bad or failure about yourself  0 0   Trouble concentrating 0 0   Moving slowly or fidgety/restless 0 0   Suicidal thoughts 0 0   PHQ-9 Score 0 0    Difficult doing work/chores Not difficult at all Not difficult at all      Data saved with a previous flowsheet row definition    History Cory Morales has a past medical history of Diabetes mellitus without complication (HCC).   He has a past surgical history that includes  Inguinal hernia repair (Right, 11/08/2017); Colonoscopy with propofol  (N/A, 10/08/2020); and polypectomy (10/08/2020).   His family history includes COPD in his mother; Diabetes in his mother; Heart disease in his mother.He reports that he has never smoked. He has never used smokeless tobacco. He reports that he does not drink alcohol and does not use drugs.    ROS Review of Systems  Constitutional: Negative.   HENT: Negative.    Eyes:  Negative for visual disturbance.  Respiratory:  Negative for cough and shortness of breath.   Cardiovascular:  Negative for chest pain and leg swelling.  Gastrointestinal:  Negative for abdominal pain, diarrhea, nausea and vomiting.  Genitourinary:  Negative for difficulty urinating.  Musculoskeletal:  Negative for arthralgias and myalgias.  Skin:  Negative for rash.  Neurological:  Negative for headaches.  Psychiatric/Behavioral:  Negative for sleep disturbance.     Objective:  BP 115/69   Pulse 82   Temp (!) 97.5 F (36.4 C)   Ht 5' 7 (1.702 m)   Wt 158 lb (71.7 kg)   SpO2 95%   BMI 24.75 kg/m   BP Readings from Last 3 Encounters:  06/29/24 115/69  12/20/23 109/69  08/11/23 113/70    Wt Readings from Last 3 Encounters:  06/29/24 158 lb (71.7 kg)  12/20/23 151 lb (68.5 kg)  08/11/23 155  lb (70.3 kg)     Physical Exam Physical Exam VITALS: BP- 115/69 GENERAL: Alert, cooperative, well developed, no acute distress. HEENT: Normocephalic, normal oropharynx, moist mucous membranes. CHEST: Clear to auscultation bilaterally, no wheezes, rhonchi, or crackles. CARDIOVASCULAR: Normal heart rate and rhythm, S1 and S2 normal without murmurs. ABDOMEN: Soft, non-tender, non-distended, without organomegaly, normal bowel sounds. EXTREMITIES: No cyanosis or edema. NEUROLOGICAL: Cranial nerves grossly intact, moves all extremities without gross motor or sensory deficit.   Assessment & Plan:  Type 2 diabetes mellitus without complication, without  long-term current use of insulin (HCC) -     Bayer DCA Hb A1c Waived -     CBC with Differential/Platelet  Hypertension, unspecified type -     Comprehensive metabolic panel with GFR -     CBC with Differential/Platelet  Mixed hyperlipidemia -     Comprehensive metabolic panel with GFR  Other orders -     SITagliptin  Phosphate; Take 1 tablet (100 mg total) by mouth daily.  Dispense: 90 tablet; Refill: 1    Assessment and Plan Assessment & Plan Type 2 diabetes mellitus   A1c is elevated at 8.7%, indicating suboptimal glycemic control. Morning blood glucose levels range from 150 to 160 mg/dL, with some improvement later in the day. Current regimen includes Jardiance  and metformin . He is not interested in weekly injections. Added Chal[place]a once daily to the regimen. Continue Jardiance  and metformin . Encourage dietary modifications to reduce carbohydrate intake, including sweets, sodas, and refined carbohydrates. Reassess A1c in six weeks.  Mixed hyperlipidemia   Managed with rosuvastatin  (Crestor ). Continue rosuvastatin  daily.  Essential hypertension   Blood pressure is well-controlled at 115/69 mmHg. Continue current antihypertensive regimen.       Follow-up: No follow-ups on file.  Butler Der, M.D. "

## 2024-07-02 ENCOUNTER — Encounter: Payer: Self-pay | Admitting: Family Medicine

## 2024-07-03 ENCOUNTER — Ambulatory Visit: Payer: Self-pay | Admitting: Family Medicine

## 2024-07-03 NOTE — Progress Notes (Signed)
 Hello [name],  Your lab result is normal and/or stable.Some minor variations that are not significant are commonly marked abnormal, but do not represent any medical problem for you.  Best regards, Mechele Claude, M.D.

## 2024-07-20 ENCOUNTER — Ambulatory Visit: Admitting: Family Medicine

## 2024-09-27 ENCOUNTER — Ambulatory Visit: Admitting: Family Medicine
